# Patient Record
Sex: Male | Born: 1980 | Hispanic: No | Smoking: Never smoker
Health system: Southern US, Community
[De-identification: ages and names within clinical notes are randomized; demographics above are authoritative.]

## PROBLEM LIST (undated history)

## (undated) DIAGNOSIS — J45909 Unspecified asthma, uncomplicated: Secondary | ICD-10-CM

## (undated) DIAGNOSIS — G43909 Migraine, unspecified, not intractable, without status migrainosus: Secondary | ICD-10-CM

## (undated) DIAGNOSIS — T7840XA Allergy, unspecified, initial encounter: Secondary | ICD-10-CM

## (undated) HISTORY — DX: Migraine, unspecified, not intractable, without status migrainosus: G43.909

## (undated) HISTORY — DX: Unspecified asthma, uncomplicated: J45.909

## (undated) HISTORY — DX: Allergy, unspecified, initial encounter: T78.40XA

## (undated) HISTORY — PX: WISDOM TOOTH EXTRACTION: SHX21

---

## 2019-07-17 DIAGNOSIS — R030 Elevated blood-pressure reading, without diagnosis of hypertension: Secondary | ICD-10-CM | POA: Diagnosis not present

## 2019-07-17 DIAGNOSIS — R3989 Other symptoms and signs involving the genitourinary system: Secondary | ICD-10-CM | POA: Diagnosis not present

## 2019-08-04 ENCOUNTER — Ambulatory Visit (INDEPENDENT_AMBULATORY_CARE_PROVIDER_SITE_OTHER): Payer: Federal, State, Local not specified - PPO | Admitting: Medical

## 2019-08-04 ENCOUNTER — Other Ambulatory Visit: Payer: Self-pay

## 2019-08-04 ENCOUNTER — Encounter: Payer: Self-pay | Admitting: Medical

## 2019-08-04 VITALS — BP 120/70 | HR 64 | Temp 98.4°F | Resp 16 | Ht 74.0 in | Wt 179.4 lb

## 2019-08-04 DIAGNOSIS — Z113 Encounter for screening for infections with a predominantly sexual mode of transmission: Secondary | ICD-10-CM

## 2019-08-04 DIAGNOSIS — Z0000 Encounter for general adult medical examination without abnormal findings: Secondary | ICD-10-CM | POA: Diagnosis not present

## 2019-08-04 DIAGNOSIS — K529 Noninfective gastroenteritis and colitis, unspecified: Secondary | ICD-10-CM

## 2019-08-04 DIAGNOSIS — R7989 Other specified abnormal findings of blood chemistry: Secondary | ICD-10-CM

## 2019-08-04 NOTE — Progress Notes (Signed)
Subjective:   HPI  Quincy Boy is a 38 y.o. male who presents for Chief Complaint  Patient presents with  . CPE    fasting CPE  NP     Patient Care Team: , Camelia Eng, PA-C as PCP - General (Family Medicine) Sees dentist  Concerns: Lived in [place], moved to [place].     Was seeing [facility] in [place] prior.  Recurrent diarrhea, most of the time stools are loose.  chronic for years since college years.  Anxiety maybe plays a role.   Has wondered about IBS.  Certain foods can trigger symptoms, particularly fried foods.  No blood in stool.   Has family history of colon cancer.  Dad is african Optometrist.  Mother is [place].    Recent saw urgent care for irritation at tip of penis.  Was having some dehydration from running out in the heat.   Had UA that showed protein and ketones in urine.  Had negative STD screen.   Those symptoms have now resolved.   Reviewed their medical, surgical, family, social, medication, and allergy history and updated chart as appropriate.  Past Medical History:  Diagnosis Date  . Allergy   . Asthma    childhood, last flare worse age 55yo and younger  . Migraine    diagnosed in 74s, none in a while, worse in 20s.  as of 07/2019    Past Surgical History:  Procedure Laterality Date  . WISDOM TOOTH EXTRACTION      Social History   Socioeconomic History  . Marital status: Single    Spouse name: Not on file  . Number of children: Not on file  . Years of education: Not on file  . Highest education level: Not on file  Occupational History  . Not on file  Social Needs  . Financial resource strain: Not on file  . Food insecurity    Worry: Not on file    Inability: Not on file  . Transportation needs    Medical: Not on file    Non-medical: Not on file  Tobacco Use  . Smoking status: Never Smoker  . Smokeless tobacco: Never Used  Substance and Sexual Activity  . Alcohol use: Yes    Alcohol/week: 6.0 standard drinks    Types: 6  Glasses of wine per week  . Drug use: Never  . Sexual activity: Not on file  Lifestyle  . Physical activity    Days per week: Not on file    Minutes per session: Not on file  . Stress: Not on file  Relationships  . Social Herbalist on phone: Not on file    Gets together: Not on file    Attends religious service: Not on file    Active member of club or organization: Not on file    Attends meetings of clubs or organizations: Not on file    Relationship status: Not on file  . Intimate partner violence    Fear of current or ex partner: Not on file    Emotionally abused: Not on file    Physically abused: Not on file    Forced sexual activity: Not on file  Other Topics Concern  . 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  Runs 3- 4 miles several days per week, some HIT work out, [employer]  3 times per week.    07/2019.    Family History  Problem Relation Age of Onset  . Arthritis Mother   . Sjogren's syndrome Mother        seropositive  . Osteoporosis Mother   . Hypertension Father   . Cancer Father        tonsillar cancer, possible HPV related  . Heart disease Maternal Grandmother   . Cancer Maternal Grandmother        colon  . COPD Maternal Grandmother   . Hypertension Maternal Grandfather   . Cancer Maternal Grandfather        breast  . Diabetes Paternal Grandmother   . Hypertension Paternal Grandmother   . Kidney disease Paternal Grandmother        dialysis    No current outpatient medications on file.  Allergies  Allergen Reactions  . Amoxicillin Other (See Comments)    Upset stomach     Review of Systems Constitutional: -fever, -chills, -sweats, -unexpected weight change, -decreased appetite, -fatigue Allergy: -sneezing, -itching, -congestion Dermatology: -changing moles, --rash, -lumps ENT: -runny nose, -ear pain, -sore throat,  -hoarseness, -sinus pain, -teeth pain, - ringing in ears, -hearing loss, -nosebleeds Cardiology: -chest pain, -palpitations, -swelling, -difficulty breathing when lying flat, -waking up short of breath Respiratory: -cough, -shortness of breath, -difficulty breathing with exercise or exertion, -wheezing, -coughing up blood Gastroenterology: -abdominal pain, -nausea, -vomiting, +diarrhea, -constipation, -blood in stool, -changes in bowel movement, -difficulty swallowing or eating Hematology: -bleeding, -bruising  Musculoskeletal: -joint aches, -muscle aches, -joint swelling, -back pain, -neck pain, -cramping, -changes in gait Ophthalmology: denies vision changes, eye redness, itching, discharge Urology: -burning with urination, -difficulty urinating, -blood in urine, -urinary frequency, -urgency, -incontinence Neurology: -headache, -weakness, -tingling, -numbness, -memory loss, -falls, -dizziness Psychology: -depressed mood, -agitation, -sleep problems Male GU: no testicular mass, pain, no lymph nodes swollen, no swelling, no rash.     Objective:  BP 120/70   Pulse 64   Temp 98.4 F (36.9 C) (Temporal)   Resp 16   Ht 6\' 2"  (1.88 m)   Wt 179 lb 6.4 oz (81.4 kg)   SpO2 98%   BMI 23.03 kg/m    General appearance: alert, no distress, WD/WN, pleasant African American/dutch Amercian male Skin: Mild facial acne, no worrisome lesions HEENT: normocephalic, conjunctiva/corneas normal, sclerae anicteric, PERRLA, EOMi, nares patent, no discharge or erythema, pharynx normal Oral cavity: MMM, tongue normal, teeth normal Neck: supple, no lymphadenopathy, no thyromegaly, no masses, normal ROM, no bruits Chest: non tender, normal shape and expansion Heart: RRR, normal S1, S2, no murmurs Lungs: CTA bilaterally, no wheezes, rhonchi, or rales Abdomen: +bs, soft, non tender, non distended, no masses, no hepatomegaly, no splenomegaly, no bruits Back: non tender, normal ROM, no scoliosis Musculoskeletal:  upper extremities non tender, no obvious deformity, normal ROM throughout, lower extremities non tender, no obvious deformity, normal ROM throughout Extremities: no edema, no cyanosis, no clubbing Pulses: 2+ symmetric, upper and lower extremities, normal cap refill Neurological: alert, oriented x 3, CN2-12 intact, strength normal upper extremities and lower extremities, sensation normal throughout, DTRs 2+ throughout, no cerebellar signs, gait normal Psychiatric: normal affect, behavior normal, pleasant  GU: normal male external genitalia,uncircumcised, nontender, no masses, no hernia, no lymphadenopathy Rectal: Deferred   Assessment and Plan :   Encounter Diagnoses  Name Primary?  . Encounter for health maintenance examination in adult Yes  . Chronic diarrhea   . Abnormal thyroid blood test   . Screen for STD (sexually transmitted disease)  Physical exam - discussed and counseled on healthy lifestyle, diet, exercise, preventative care, vaccinations, sick and well care, proper use of emergency dept and after hours care, and addressed their concerns.    Health screening: See your eye doctor yearly for routine vision care. See your dentist yearly for routine dental care including hygiene visits twice yearly.  Discussed STD testing, discussed prevention, condom use, means of transmission.  He notes recent gonorrhea chlamydia and trichomonas test negative through urgent care.  Cancer screening Advised monthly self testicular exam  Colonoscopy:  Discussed possible colonoscopy depending on labs.  He does have a history of chronic diarrhea which we discussed.  Discussed PSA, prostate exam, and prostate cancer screening risks/benefits.     Vaccinations: Advised yearly influenza vaccine He reports up-to-date on vaccines as he is a Engineer, civil (consulting)nurse works in healthcare through the [employer] [facility] and [facility].  We will try to get some vaccine records on file  Separate significant chronic issues  discussed: Chronic diarrhea-discussed possible causes, he will work on certain trigger food avoidance.  Labs today.  Consider GI consult.  Jillyn HiddenLeander was seen today for cpe.  Diagnoses and all orders for this visit:  Encounter for health maintenance examination in adult -     Comprehensive metabolic panel -     CBC with Differential/Platelet -     TSH -     Lipid panel -     HIV Antibody (routine testing w rflx) -     RPR -     T4, free  Chronic diarrhea  Abnormal thyroid blood test -     TSH -     T4, free  Screen for STD (sexually transmitted disease) -     HIV Antibody (routine testing w rflx) -     RPR   Follow-up pending labs, yearly for physical

## 2019-08-05 LAB — COMPREHENSIVE METABOLIC PANEL
ALT: 21 IU/L (ref 0–44)
AST: 24 IU/L (ref 0–40)
Albumin/Globulin Ratio: 2.3 — ABNORMAL HIGH (ref 1.2–2.2)
Albumin: 4.8 g/dL (ref 4.0–5.0)
Alkaline Phosphatase: 56 IU/L (ref 39–117)
BUN/Creatinine Ratio: 15 (ref 9–20)
BUN: 14 mg/dL (ref 6–20)
Bilirubin Total: 0.3 mg/dL (ref 0.0–1.2)
CO2: 24 mmol/L (ref 20–29)
Calcium: 9.6 mg/dL (ref 8.7–10.2)
Chloride: 101 mmol/L (ref 96–106)
Creatinine, Ser: 0.92 mg/dL (ref 0.76–1.27)
GFR calc Af Amer: 122 mL/min/{1.73_m2} (ref >59)
GFR calc non Af Amer: 106 mL/min/{1.73_m2} (ref >59)
Globulin, Total: 2.1 g/dL (ref 1.5–4.5)
Glucose: 97 mg/dL (ref 65–99)
Potassium: 4.3 mmol/L (ref 3.5–5.2)
Sodium: 141 mmol/L (ref 134–144)
Total Protein: 6.9 g/dL (ref 6.0–8.5)

## 2019-08-05 LAB — CBC WITH DIFFERENTIAL/PLATELET
Basophils Absolute: 0 10*3/uL (ref 0.0–0.2)
Basos: 0 %
EOS (ABSOLUTE): 0 10*3/uL (ref 0.0–0.4)
Eos: 0 %
Hematocrit: 46.8 % (ref 37.5–51.0)
Hemoglobin: 15.2 g/dL (ref 13.0–17.7)
Immature Grans (Abs): 0 10*3/uL (ref 0.0–0.1)
Immature Granulocytes: 0 %
Lymphocytes Absolute: 1.3 10*3/uL (ref 0.7–3.1)
Lymphs: 18 %
MCH: 29 pg (ref 26.6–33.0)
MCHC: 32.5 g/dL (ref 31.5–35.7)
MCV: 89 fL (ref 79–97)
Monocytes Absolute: 0.5 10*3/uL (ref 0.1–0.9)
Monocytes: 6 %
Neutrophils Absolute: 5.6 10*3/uL (ref 1.4–7.0)
Neutrophils: 76 %
Platelets: 167 10*3/uL (ref 150–450)
RBC: 5.25 x10E6/uL (ref 4.14–5.80)
RDW: 13.3 % (ref 11.6–15.4)
WBC: 7.4 10*3/uL (ref 3.4–10.8)

## 2019-08-05 LAB — LIPID PANEL
Chol/HDL Ratio: 2.9 ratio (ref 0.0–5.0)
Cholesterol, Total: 146 mg/dL (ref 100–199)
HDL: 51 mg/dL (ref >39)
LDL Calculated: 86 mg/dL (ref 0–99)
Triglycerides: 46 mg/dL (ref 0–149)
VLDL Cholesterol Cal: 9 mg/dL (ref 5–40)

## 2019-08-05 LAB — HIV ANTIBODY (ROUTINE TESTING W REFLEX): HIV Screen 4th Generation wRfx: NONREACTIVE

## 2019-08-05 LAB — RPR: RPR Ser Ql: NONREACTIVE

## 2019-08-05 LAB — T4, FREE: Free T4: 1.24 ng/dL (ref 0.82–1.77)

## 2019-08-05 LAB — TSH: TSH: 0.702 u[IU]/mL (ref 0.450–4.500)

## 2019-08-18 ENCOUNTER — Telehealth: Payer: Self-pay | Admitting: Medical

## 2019-08-18 NOTE — Telephone Encounter (Signed)
Received requested records from [facility].

## 2019-08-31 ENCOUNTER — Telehealth: Payer: Self-pay | Admitting: Medical

## 2019-08-31 NOTE — Telephone Encounter (Signed)
Request records received from [facility]

## 2019-09-16 ENCOUNTER — Encounter: Payer: Self-pay | Admitting: Medical

## 2019-09-23 ENCOUNTER — Encounter: Payer: Self-pay | Admitting: Medical

## 2019-10-19 ENCOUNTER — Other Ambulatory Visit: Payer: Self-pay

## 2019-10-19 ENCOUNTER — Encounter: Payer: Self-pay | Admitting: Medical

## 2019-10-19 ENCOUNTER — Ambulatory Visit: Payer: Federal, State, Local not specified - PPO | Admitting: Medical

## 2019-10-19 VITALS — Temp 97.9°F | Ht 73.0 in | Wt 180.0 lb

## 2019-10-19 DIAGNOSIS — R05 Cough: Secondary | ICD-10-CM | POA: Diagnosis not present

## 2019-10-19 DIAGNOSIS — J3489 Other specified disorders of nose and nasal sinuses: Secondary | ICD-10-CM

## 2019-10-19 DIAGNOSIS — Z20822 Contact with and (suspected) exposure to covid-19: Secondary | ICD-10-CM

## 2019-10-19 DIAGNOSIS — R059 Cough, unspecified: Secondary | ICD-10-CM

## 2019-10-19 MED ORDER — CLARITHROMYCIN 500 MG PO TABS: 500.0000 mg | ORAL_TABLET | Freq: Two times a day (BID) | ORAL | 0 refills | Status: DC

## 2019-10-19 NOTE — Progress Notes (Signed)
Subjective:     Patient ID: Elijah Rojas, male   DOB: [birth year], 38 y.o.   MRN: [id number]  This visit type was conducted due to national recommendations for restrictions regarding the COVID-19 Pandemic (e.g. social distancing) in an effort to limit this patient's exposure and mitigate transmission in our community.  Due to their co-morbid illnesses, this patient is at least at moderate risk for complications without adequate follow up.  This format is felt to be most appropriate for this patient at this time.    Documentation for virtual audio and video telecommunications through Zoom encounter:  The patient was located at home. The provider was located in the office. The patient did consent to this visit and is aware of possible charges through their insurance for this visit.  The other persons participating in this telemedicine service were none. Time spent on call was 15 minutes and in review of previous records >15 minutes total.  This virtual service is not related to other E/M service within previous 7 days.   HPI Chief Complaint  Patient presents with  . Sinus Problem    nasal congestion, sinus drainage, green phlegm   Virtual consult today for possible sinus infection.  He reports about 4-5 day history of sinus pressure, sinus drainage, head congestion, mild sore throat, some cough related to drainage, but cannot seem to get rid of his congestion.  He is using Afrin nasal spray, over-the-counter sinus decongestant and guaifenesin.  No sick contacts.  No fever no nausea or vomiting, no shortness of breath, no wheezing, no change in smell or taste.  No dizziness. Ears feel a little stuffy, pressure.   Gets bad fall allergies.  Has a history of sinus infections for like this.  Using some salt water gargles.  Works in Corporate treasurer, worked directly with Covid patients several months ago when the pandemic for started.  Has had several negative Covid test in recent months.  No other  aggravating or relieving factors. No other complaint.  Past Medical History:  Diagnosis Date  . Allergy   . Asthma    childhood, last flare worse age 69yo and younger  . Migraine    diagnosed in 49s, none in a while, worse in 20s.  as of 07/2019   No current outpatient medications on file prior to visit.   No current facility-administered medications on file prior to visit.     Review of Systems As in subjective    Objective:   Physical Exam Due to coronavirus pandemic stay at home measures, patient visit was virtual and they were not examined in person.   Temp 97.9 F (36.6 C)   Ht 6\' 1"  (1.854 m)   Wt 180 lb (81.6 kg)   BMI 23.75 kg/m       Assessment:     Encounter Diagnoses  Name Primary?  . Sinus pressure Yes  . Cough        Plan:     Symptoms suggest possible early sinus infection.  Advised that I cannot completely rule out Covid given the spread in the community although his symptoms suggest more sinus related.  Advise he go for testing just to confirm for the sake of work and coworkers.  Discussed self quarantine until results come back to be safe.  Continue good hydration, add nasal saline flush, can continue Sudafed, cautioned on prolonged use of Afrin nasal spray.  If symptoms worsening or not improving at all in the next 48 hours can consider Biaxin antibiotic.  F/u pending lab.  Mahesh was seen today for sinus problem.  Diagnoses and all orders for this visit:  Sinus pressure -     Novel Coronavirus, NAA ([facility])  Cough -     Novel Coronavirus, NAA ([facility])  Other orders -     clarithromycin (BIAXIN) 500 MG tablet; Take 1 tablet (500 mg total) by mouth 2 (two) times daily.

## 2019-10-21 LAB — NOVEL CORONAVIRUS, NAA: SARS-CoV-2, NAA: NOT DETECTED

## 2020-06-19 ENCOUNTER — Other Ambulatory Visit: Payer: Self-pay

## 2020-06-19 ENCOUNTER — Emergency Department
Admission: RE | Admit: 2020-06-19 | Discharge: 2020-06-19 | Disposition: A | Discharge status: Home / Self Care | Payer: Federal, State, Local not specified - PPO | Source: Ambulatory Visit

## 2020-06-19 ENCOUNTER — Emergency Department (INDEPENDENT_AMBULATORY_CARE_PROVIDER_SITE_OTHER): Payer: Federal, State, Local not specified - PPO

## 2020-06-19 VITALS — BP 151/89 | HR 69 | Temp 98.7°F | Ht 73.0 in | Wt 190.0 lb

## 2020-06-19 DIAGNOSIS — S8991XA Unspecified injury of right lower leg, initial encounter: Secondary | ICD-10-CM | POA: Diagnosis not present

## 2020-06-19 DIAGNOSIS — W010XXA Fall on same level from slipping, tripping and stumbling without subsequent striking against object, initial encounter: Secondary | ICD-10-CM

## 2020-06-19 DIAGNOSIS — M25461 Effusion, right knee: Secondary | ICD-10-CM

## 2020-06-19 DIAGNOSIS — M25561 Pain in right knee: Secondary | ICD-10-CM

## 2020-06-19 DIAGNOSIS — M7989 Other specified soft tissue disorders: Secondary | ICD-10-CM | POA: Diagnosis not present

## 2020-06-19 DIAGNOSIS — T07XXXA Unspecified multiple injuries, initial encounter: Secondary | ICD-10-CM

## 2020-06-19 NOTE — Discharge Instructions (Signed)
°  You may take 500mg acetaminophen every 4-6 hours or in combination with ibuprofen 400-600mg every 6-8 hours as needed for pain and inflammation.  Call to schedule a follow up appointment with Sports Medicine in 1-2 weeks if not improving, sooner if worsening. 

## 2020-06-19 NOTE — ED Triage Notes (Signed)
Fall on knees coming out of [employer] x 3 days ago. Rt knee worse than left.

## 2020-06-19 NOTE — ED Provider Notes (Signed)
[facility]    CSN: [id number] Arrival date & time: 06/19/20  1453      History   Chief Complaint Chief Complaint  Patient presents with  . Fall    HPI Elijah Rojas is a 39 y.o. male.   HPI  Elijah Rojas is a 39 y.o. male presenting to UC with c/o bilateral knee pain, Right worse than Left after trip and fall at Home 3 days ago. Pt fell on asphalt. He has abrasions on both knees but swelling and soreness is worse in Right knee. No prior hx of knee problems. He has been taking 800mg  ibuprofen "around the clock," last dose around 10AM. Pain is 6/10.    Past Medical History:  Diagnosis Date  . Allergy   . Asthma    childhood, last flare worse age 20yo and younger  . Migraine    diagnosed in 73s, none in a while, worse in 20s.  as of 07/2019    Patient Active Problem List   Diagnosis Date Noted  . Chronic diarrhea 08/04/2019  . Screen for STD (sexually transmitted disease) 08/04/2019  . Abnormal thyroid blood test 08/04/2019    Past Surgical History:  Procedure Laterality Date  . WISDOM TOOTH EXTRACTION         Home Medications    Prior to Admission medications   Not on File    Family History Family History  Problem Relation Age of Onset  . Arthritis Mother   . Sjogren's syndrome Mother        seropositive  . Osteoporosis Mother   . Hypertension Father   . Cancer Father        tonsillar cancer, possible HPV related  . Heart disease Maternal Grandmother   . Cancer Maternal Grandmother        colon  . COPD Maternal Grandmother   . Hypertension Maternal Grandfather   . Cancer Maternal Grandfather        breast  . Diabetes Paternal Grandmother   . Hypertension Paternal Grandmother   . Kidney disease Paternal Grandmother        dialysis    Social History Social History   Tobacco Use  . Smoking status: Never Smoker  . Smokeless tobacco: Never Used  Vaping Use  . Vaping Use: Never used  Substance Use Topics  . Alcohol use: Yes      Alcohol/week: 6.0 standard drinks    Types: 6 Glasses of wine per week  . Drug use: Never     Allergies   Amoxicillin   Review of Systems Review of Systems  Musculoskeletal: Positive for arthralgias and joint swelling.  Skin: Positive for wound. Negative for color change.  Neurological: Negative for weakness and numbness.     Physical Exam Triage Vital Signs ED Triage Vitals  Enc Vitals Group     BP 06/19/20 1558 (!) 151/89     Pulse Rate 06/19/20 1558 69     Resp --      Temp 06/19/20 1558 98.7 F (37.1 C)     Temp Source 06/19/20 1558 Oral     SpO2 06/19/20 1558 100 %     Weight 06/19/20 1600 190 lb (86.2 kg)     Height 06/19/20 1600 6\' 1"  (1.854 m)     Head Circumference --      Peak Flow --      Pain Score 06/19/20 1559 6     Pain Loc --      Pain Edu? --  Excl. in GC? --    No data found.  Updated Vital Signs BP (!) 151/89 (BP Location: Right Arm)   Pulse 69   Temp 98.7 F (37.1 C) (Oral)   Ht 6\' 1"  (1.854 m)   Wt 190 lb (86.2 kg)   SpO2 100%   BMI 25.07 kg/m   Visual Acuity Right Eye Distance:   Left Eye Distance:   Bilateral Distance:    Right Eye Near:   Left Eye Near:    Bilateral Near:     Physical Exam Vitals and nursing note reviewed.  Constitutional:      Appearance: Normal appearance. He is well-developed.  HENT:     Head: Normocephalic and atraumatic.  Cardiovascular:     Rate and Rhythm: Normal rate.  Pulmonary:     Effort: Pulmonary effort is normal.  Musculoskeletal:        General: Swelling and tenderness present. Normal range of motion.     Cervical back: Normal range of motion.     Comments: Right knee: moderate edema, tenderness superior to and directly over patella. Full ROM w/o crepitus.  Skin:    General: Skin is warm and dry.     Comments: Bilateral anterior knees: superficial abrasions. No surrounding erythema. No purulent discharge.  Neurological:     Mental Status: He is alert and oriented to person,  place, and time.  Psychiatric:        Behavior: Behavior normal.      UC Treatments / Results  Labs (all labs ordered are listed, but only abnormal results are displayed) Labs Reviewed - No data to display  EKG   Radiology DG Knee Complete 4 Views Right  Result Date: 06/19/2020 CLINICAL DATA:  Fall 2 days ago.  Pain and swelling EXAM: RIGHT KNEE - COMPLETE 4+ VIEW COMPARISON:  None. FINDINGS: Negative for fracture, or joint effusion. Joint spaces normal. Anterior soft tissue swelling. IMPRESSION: Negative for fracture.  Anterior soft tissue swelling. Electronically Signed   By: 06/21/2020 M.D.   On: 06/19/2020 16:44    Procedures Procedures (including critical care time)  Medications Ordered in UC Medications - No data to display  Initial Impression / Assessment and Plan / UC Course  I have reviewed the triage vital signs and the nursing notes.  Pertinent labs & imaging results that were available during my care of the patient were reviewed by me and considered in my medical decision making (see chart for details).     Discussed imaging with pt Knee brace provided for comfort Encouraged f/u Sports Med as needed AVS provided  Final Clinical Impressions(s) / UC Diagnoses   Final diagnoses:  Fall from slip, trip, or stumble, initial encounter  Multiple abrasions  Pain and swelling of right knee     Discharge Instructions      You may take 500mg  acetaminophen every 4-6 hours or in combination with ibuprofen 400-600mg  every 6-8 hours as needed for pain and inflammation.  Call to schedule a follow up appointment with Sports Medicine in 1-2 weeks if not improving, sooner if worsening.     ED Prescriptions    None     PDMP not reviewed this encounter.   06/21/2020, 06/19/20 1701

## 2020-10-17 IMAGING — DX DG KNEE COMPLETE 4+V*R*
4 series · 4 of 4 positions shown · non-contrast
Comparison: None.

CLINICAL DATA: Fall 2 days ago.  Pain and swelling

EXAM:
RIGHT KNEE - COMPLETE 4+ VIEW

[knee ap]
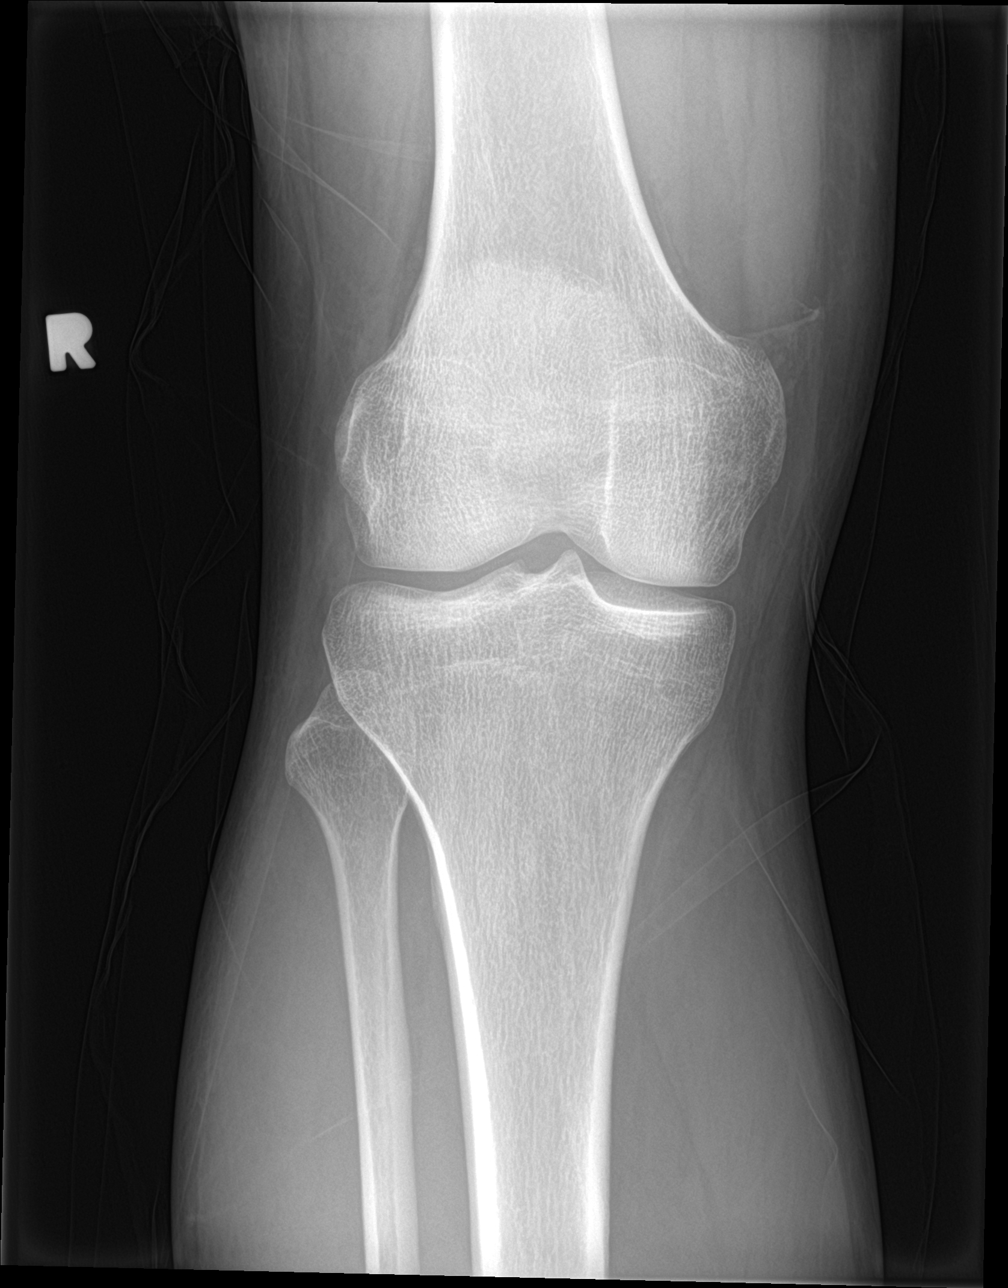

[knee lat]
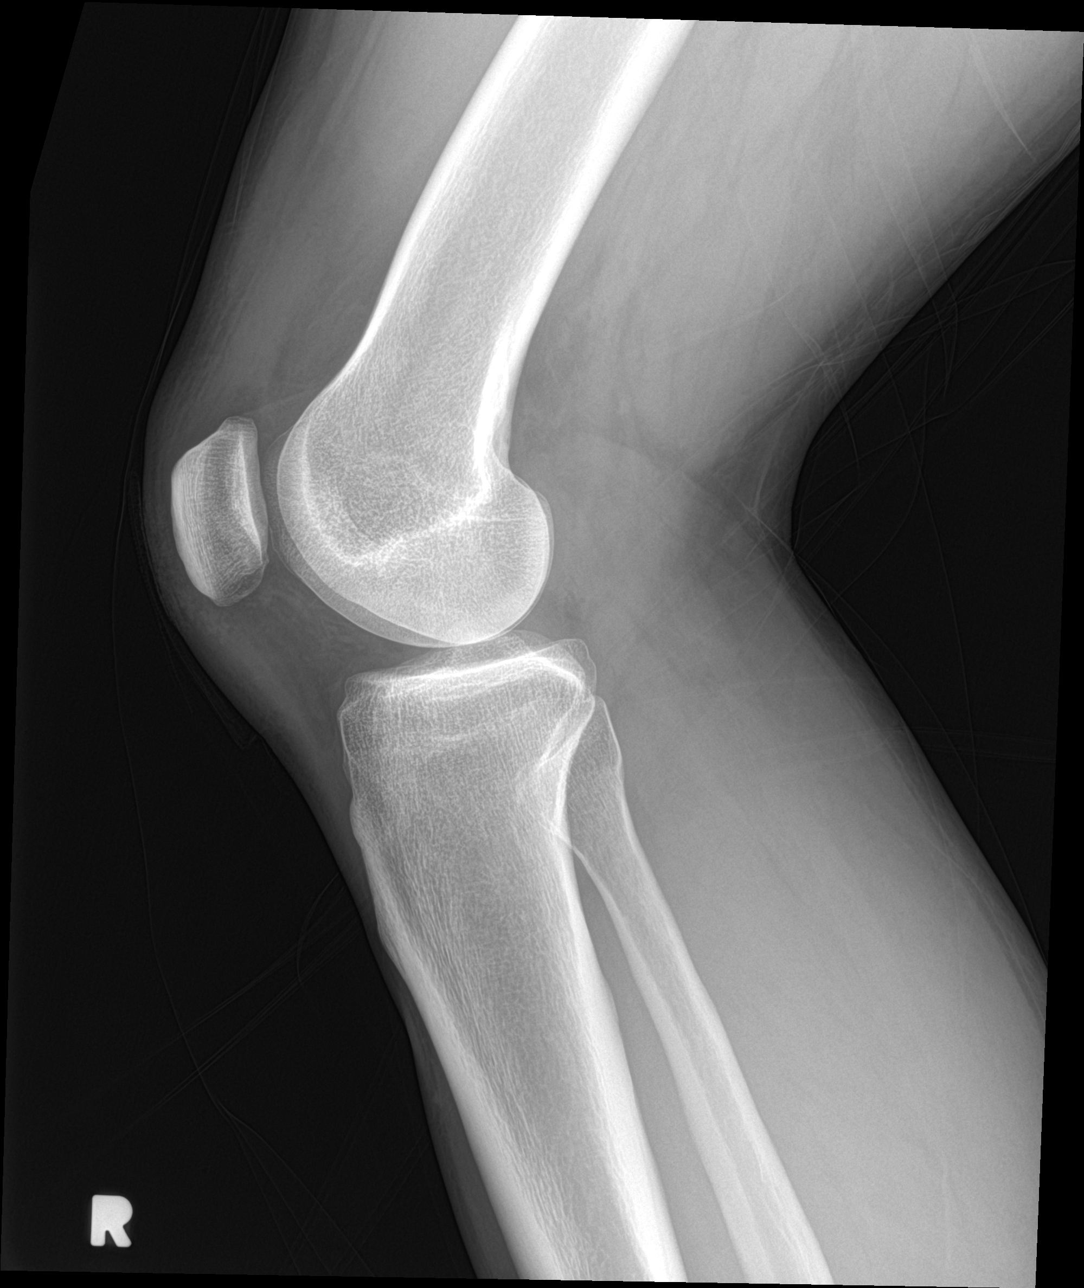

[knee obl (1 of 2)]
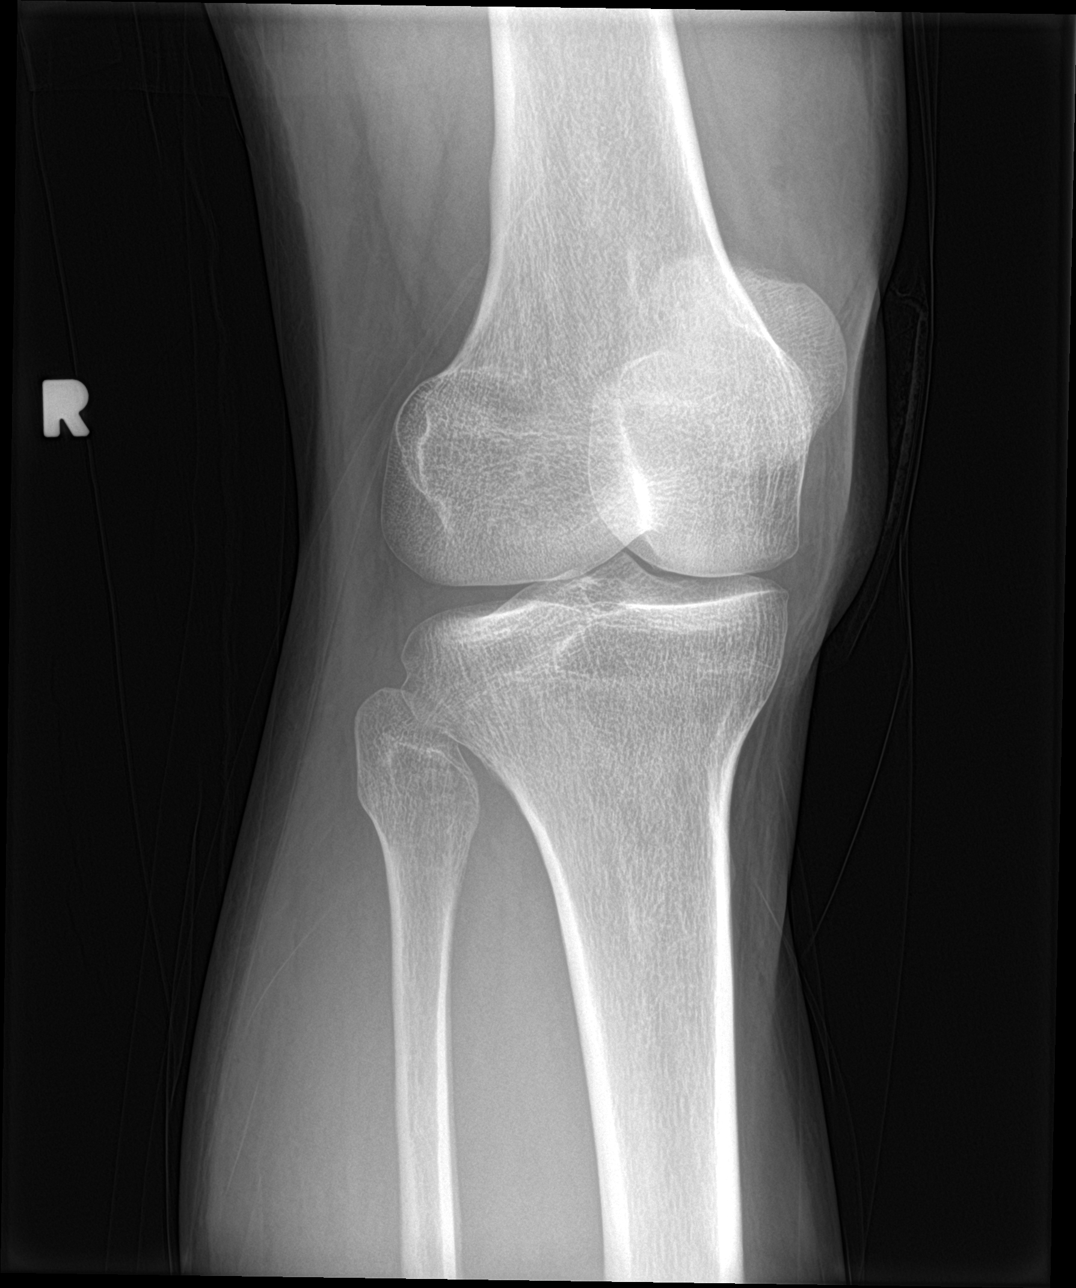

[knee obl (2 of 2)]
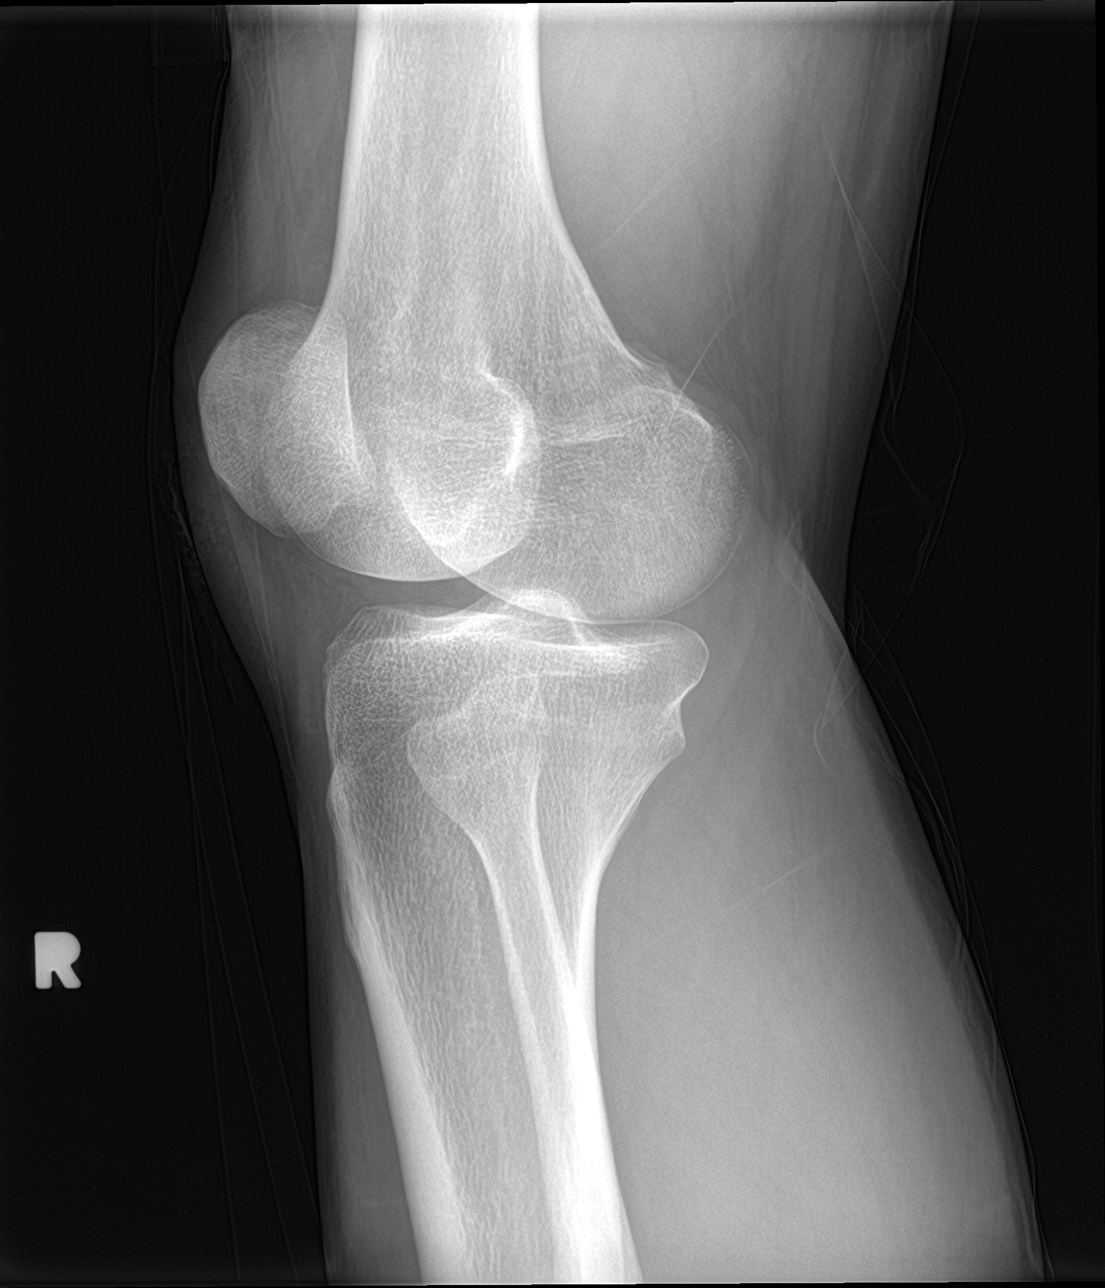

[4 of 4 positions shown; findings below may reference images not displayed]

FINDINGS: Negative for fracture, or joint effusion. Joint spaces normal.
Anterior soft tissue swelling.
IMPRESSION: Negative for fracture.  Anterior soft tissue swelling.

## 2020-12-14 ENCOUNTER — Telehealth: Payer: Federal, State, Local not specified - PPO | Admitting: Emergency Medicine

## 2020-12-14 DIAGNOSIS — R300 Dysuria: Secondary | ICD-10-CM

## 2020-12-14 NOTE — Progress Notes (Signed)
Based on what you shared with me, I feel your condition warrants further evaluation and I recommend that you be seen for a face to face office visit.  Male bladder infections are not very common.  We worry about prostate or kidney conditions.  The standard of care is to examine the abdomen and kidneys, and to do a urine and blood test to make sure that something more serious is not going on.  We recommend that you see a provider today.  If your doctor's office is closed [facility] has the following Urgent Cares:    NOTE: If you entered your credit card information for this eVisit, you will not be charged. You may see a "hold" on your card for the $35 but that hold will drop off and you will not have a charge processed.   If you are having a true medical emergency please call 911.     For an urgent face to face visit, [facility] has four urgent care centers for your convenience:    NEW:  [facility] [phone] [address] [place], [place] [address] .  Monday - Friday 10 am - 6 pm    . [facility]    [phone]                  Get Driving Directions  [address] , [place] [address] . 10 am to 8 pm Monday-Friday . 12 pm to 8 pm Saturday-Sunday   . [facility]  [phone]                  Get Driving Directions  [address] [place], [place] [address] . 8 am to 8 pm Monday-Friday . 9 am to 6 pm Saturday . 11 am to 6 pm Sunday   . [facility]  [phone]                  Get Driving Directions   [address] [place], [place] [address] . 8 am to 8 pm Monday-Friday . 8 am to 4 pm Saturday-Sunday    . [facility] [place]                    Get Driving Directions  [phone]  [address] [place], [place] [address]  . Monday-Friday, 12 PM to 6 PM    Your e-visit  answers were reviewed by a board certified advanced clinical practitioner to complete your personal care plan.  Thank you for using e-Visits.  Approximately 5 minutes was used in reviewing the patient's chart, questionnaire, prescribing medications, and documentation.  

## 2020-12-15 ENCOUNTER — Encounter: Payer: Self-pay | Admitting: Medical

## 2020-12-15 ENCOUNTER — Other Ambulatory Visit: Payer: Self-pay

## 2020-12-15 ENCOUNTER — Ambulatory Visit: Payer: Federal, State, Local not specified - PPO | Admitting: Medical

## 2020-12-15 VITALS — BP 126/86 | HR 83 | Ht 73.0 in | Wt 188.6 lb

## 2020-12-15 DIAGNOSIS — Z113 Encounter for screening for infections with a predominantly sexual mode of transmission: Secondary | ICD-10-CM | POA: Diagnosis not present

## 2020-12-15 DIAGNOSIS — B001 Herpesviral vesicular dermatitis: Secondary | ICD-10-CM

## 2020-12-15 DIAGNOSIS — R300 Dysuria: Secondary | ICD-10-CM | POA: Diagnosis not present

## 2020-12-15 DIAGNOSIS — N4889 Other specified disorders of penis: Secondary | ICD-10-CM | POA: Diagnosis not present

## 2020-12-15 LAB — POCT URINALYSIS DIP (PROADVANTAGE DEVICE)
Bilirubin, UA: NEGATIVE
Blood, UA: NEGATIVE
Glucose, UA: NEGATIVE mg/dL
Ketones, POC UA: NEGATIVE mg/dL
Leukocytes, UA: NEGATIVE
Nitrite, UA: NEGATIVE
Protein Ur, POC: NEGATIVE mg/dL
Specific Gravity, Urine: 1.025
Urobilinogen, Ur: 0.2
pH, UA: 6 (ref 5.0–8.0)

## 2020-12-15 MED ORDER — VALACYCLOVIR HCL 1 G PO TABS: 2000.0000 mg | ORAL_TABLET | Freq: Two times a day (BID) | ORAL | 0 refills | Status: DC

## 2020-12-15 NOTE — Progress Notes (Signed)
Subjective:  Elijah Rojas is a 39 y.o. male who presents for Chief Complaint  Patient presents with   Urinary Tract Infection    Burning with urination seems better but still some irritation      Here today for concern about urination.  He notes a week history of burning with urination, irritation of the penis but no rash.  No swollen nodes, no body aches or chills no fever no nausea or vomiting.  No penile discharge.  No blood in the urine.  No bowel changes.  He does have unprotected sex with his male partner.   He notes getting chlamydia from the same partner a few years ago and had similar symptoms.  He is concerned about possible STD.  Currently he and his partner do not endorse other partners.  He does drink a fair amount of caffeine.  No recent changes in soap or hygiene products.  He has a cold sore that just started a few days ago in his nose.  No prior use of Valtrex.  Typically gets 1-2 cold sores per year.  No other aggravating or relieving factors.    No other c/o.  The following portions of the patient's history were reviewed and updated as appropriate: allergies, current medications, past family history, past medical history, past social history, past surgical history and problem list.  ROS Otherwise as in subjective above    Objective: BP 126/86    Pulse 83    Ht 6\' 1"  (1.854 m)    Wt 188 lb 9.6 oz (85.5 kg)    SpO2 98%    BMI 24.88 kg/m   General appearance: alert, no distress, well developed, well nourished GU: Normal GU, uncircumcised, no mass no nodules no lymphadenopathy no rash noted chancre   Assessment: Encounter Diagnoses  Name Primary?   Dysuria Yes   Penile irritation    Screen for STD (sexually transmitted disease)    Cold sore      Plan: Lab testing today.  Hydrate well, limit caffeine.  We discussed possible differential.  Need to rule out STD.  Urinalysis normal today.  Cold sore history-discussed proper use of Valtrex for flareup.  No  indication for prophylactic therapy daily for prevention at this point  Vedanth was seen today for urinary tract infection.  Diagnoses and all orders for this visit:  Dysuria -     GC/Chlamydia Probe Amp -     Cancel: HIV Antibody (routine testing w rflx) -     Cancel: RPR -     HIV Antibody (routine testing w rflx) -     RPR -     POCT Urinalysis DIP (Proadvantage Device)  Penile irritation -     GC/Chlamydia Probe Amp -     Cancel: HIV Antibody (routine testing w rflx) -     Cancel: RPR -     HIV Antibody (routine testing w rflx) -     RPR -     POCT Urinalysis DIP (Proadvantage Device)  Screen for STD (sexually transmitted disease) -     GC/Chlamydia Probe Amp -     Cancel: HIV Antibody (routine testing w rflx) -     Cancel: RPR -     HIV Antibody (routine testing w rflx) -     RPR  Cold sore  Other orders -     valACYclovir (VALTREX) 1000 MG tablet; Take 2 tablets (2,000 mg total) by mouth 2 (two) times daily. X 1 day for flare up  Follow up: pending labs

## 2020-12-16 LAB — RPR: RPR Ser Ql: NONREACTIVE

## 2020-12-16 LAB — HIV ANTIBODY (ROUTINE TESTING W REFLEX): HIV Screen 4th Generation wRfx: NONREACTIVE

## 2020-12-18 ENCOUNTER — Other Ambulatory Visit: Payer: Self-pay | Admitting: Medical

## 2020-12-18 LAB — GC/CHLAMYDIA PROBE AMP
Chlamydia trachomatis, NAA: NEGATIVE
Neisseria Gonorrhoeae by PCR: NEGATIVE

## 2020-12-18 MED ORDER — AZITHROMYCIN 500 MG PO TABS: 1000.0000 mg | ORAL_TABLET | Freq: Once | ORAL | 0 refills | Status: AC

## 2020-12-21 ENCOUNTER — Other Ambulatory Visit: Payer: Self-pay | Admitting: Medical

## 2020-12-21 MED ORDER — SULFAMETHOXAZOLE-TRIMETHOPRIM 800-160 MG PO TABS
1.0000 | ORAL_TABLET | Freq: Two times a day (BID) | ORAL | 0 refills | Status: DC
Start: 2020-12-21 — End: 2021-01-19

## 2021-01-19 ENCOUNTER — Ambulatory Visit: Payer: Federal, State, Local not specified - PPO | Admitting: Medical

## 2021-01-19 ENCOUNTER — Other Ambulatory Visit: Payer: Self-pay

## 2021-01-19 ENCOUNTER — Encounter: Payer: Self-pay | Admitting: Medical

## 2021-01-19 VITALS — BP 140/78 | HR 67 | Ht 73.0 in | Wt 188.2 lb

## 2021-01-19 DIAGNOSIS — Z0000 Encounter for general adult medical examination without abnormal findings: Secondary | ICD-10-CM | POA: Diagnosis not present

## 2021-01-19 DIAGNOSIS — Z23 Encounter for immunization: Secondary | ICD-10-CM | POA: Diagnosis not present

## 2021-01-19 DIAGNOSIS — R5381 Other malaise: Secondary | ICD-10-CM

## 2021-01-19 DIAGNOSIS — Z113 Encounter for screening for infections with a predominantly sexual mode of transmission: Secondary | ICD-10-CM

## 2021-01-19 DIAGNOSIS — Z1159 Encounter for screening for other viral diseases: Secondary | ICD-10-CM

## 2021-01-19 DIAGNOSIS — G8929 Other chronic pain: Secondary | ICD-10-CM

## 2021-01-19 DIAGNOSIS — Z13220 Encounter for screening for lipoid disorders: Secondary | ICD-10-CM

## 2021-01-19 DIAGNOSIS — R102 Pelvic and perineal pain: Secondary | ICD-10-CM

## 2021-01-19 DIAGNOSIS — R946 Abnormal results of thyroid function studies: Secondary | ICD-10-CM | POA: Diagnosis not present

## 2021-01-19 LAB — POCT URINALYSIS DIP (PROADVANTAGE DEVICE)
Bilirubin, UA: NEGATIVE
Blood, UA: NEGATIVE
Glucose, UA: NEGATIVE mg/dL
Ketones, POC UA: NEGATIVE mg/dL
Leukocytes, UA: NEGATIVE
Nitrite, UA: NEGATIVE
Specific Gravity, Urine: 1.025
Urobilinogen, Ur: 0.2
pH, UA: 6 (ref 5.0–8.0)

## 2021-01-19 NOTE — Progress Notes (Signed)
Subjective:   HPI  Elijah Rojas is a 40 y.o. male who presents for Chief Complaint  Patient presents with  . Annual Exam    Physical with fasting labs. Needs Tetanus vaccine     Patient Care Team: Willman Cuny, Kermit Balo, PA-C as PCP - General (Family Medicine) Sees dentist  Concerns: Saw me few weeks ago for possible prostate vs STD concern.   Finished 2 different antibiotic but still doesn't feel right in pelvis.  Partner did not do any treatment at that time.   No fever, no body aches or chills.  Needs Td updated.  Has family history of colon cancer.  Dad is african Naval architect.  Mother is [place].    Reviewed their medical, surgical, family, social, medication, and allergy history and updated chart as appropriate.  Past Medical History:  Diagnosis Date  . Allergy   . Asthma    childhood, last flare worse age 66yo and younger  . Migraine    diagnosed in 52s, none in a while, worse in 20s.  as of 07/2019    Past Surgical History:  Procedure Laterality Date  . WISDOM TOOTH EXTRACTION      Social History   Socioeconomic History  . Marital status: Significant Other    Spouse name: Not on file  . Number of children: Not on file  . Years of education: Not on file  . Highest education level: Not on file  Occupational History  . Not on file  Tobacco Use  . Smoking status: Never Smoker  . Smokeless tobacco: Never Used  Vaping Use  . Vaping Use: Never used  Substance and Sexual Activity  . Alcohol use: Yes    Alcohol/week: 6.0 standard drinks    Types: 6 Glasses of wine per week  . Drug use: Never  . Sexual activity: Not on file  Other Topics Concern  . 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  Runs 3- 4 miles several days per week, some HIT work out, weights 3 times per week.   12/2020   Social Determinants of Health   Financial Resource Strain: Not  on file  Food Insecurity: Not on file  Transportation Needs: Not on file  Physical Activity: Not on file  Stress: Not on file  Social Connections: Not on file  Intimate Partner Violence: Not on file    Family History  Problem Relation Age of Onset  . Arthritis Mother   . Sjogren's syndrome Mother        seropositive  . Osteoporosis Mother   . Hypertension Father   . Cancer Father        tonsillar cancer, possible HPV related  . Heart disease Maternal Grandmother   . COPD Maternal Grandmother   . Hypertension Maternal Grandfather   . Cancer Maternal Grandfather        colon  . Hypertension Paternal Grandmother   . Kidney disease Paternal Grandmother        dialysis  . Cancer Paternal Grandmother        breast  . Cancer Maternal Aunt        colon  . Diabetes Paternal Grandfather      Current Outpatient Medications:  .  valACYclovir (VALTREX) 1000 MG tablet, Take 2 tablets (2,000 mg total) by mouth 2 (two) times daily. X 1 day for flare up (  Patient not taking: Reported on 01/19/2021), Disp: 30 tablet, Rfl: 0  Allergies  Allergen Reactions  . Amoxicillin Other (See Comments)    Upset stomach     Review of Systems Constitutional: -fever, -chills, -sweats, -unexpected weight change, -decreased appetite, -fatigue Allergy: -sneezing, -itching, -congestion Dermatology: -changing moles, --rash, -lumps ENT: -runny nose, -ear pain, -sore throat, -hoarseness, -sinus pain, -teeth pain, - ringing in ears, -hearing loss, -nosebleeds Cardiology: -chest pain, -palpitations, -swelling, -difficulty breathing when lying flat, -waking up short of breath Respiratory: -cough, -shortness of breath, -difficulty breathing with exercise or exertion, -wheezing, -coughing up blood Gastroenterology: -abdominal pain, -nausea, -vomiting, -diarrhea, -constipation, -blood in stool, -changes in bowel movement, -difficulty swallowing or eating Hematology: -bleeding, -bruising  Musculoskeletal: -joint  aches, -muscle aches, -joint swelling, -back pain, -neck pain, -cramping, -changes in gait Ophthalmology: denies vision changes, eye redness, itching, discharge Urology: -burning with urination, -difficulty urinating, -blood in urine, -urinary frequency, -urgency, -incontinence Neurology: -headache, -weakness, -tingling, -numbness, -memory loss, -falls, -dizziness Psychology: -depressed mood, -agitation, -sleep problems Male GU: no testicular mass, +pain, no lymph nodes swollen, no swelling, no rash.     Objective:  BP 140/78   Pulse 67   Ht 6\' 1"  (1.854 m)   Wt 188 lb 3.2 oz (85.4 kg)   SpO2 98%   BMI 24.83 kg/m    General appearance: alert, no distress, WD/WN, pleasant African American/dutch American male Skin: Mild facial acne, no worrisome lesions HEENT: normocephalic, conjunctiva/corneas normal, sclerae anicteric, PERRLA, EOMi, nares patent, no discharge or erythema, pharynx normal Oral cavity: MMM, tongue normal, teeth normal Neck: supple, no lymphadenopathy, no thyromegaly, no masses, normal ROM, no bruits Chest: non tender, normal shape and expansion Heart: RRR, normal S1, S2, no murmurs Lungs: CTA bilaterally, no wheezes, rhonchi, or rales Abdomen: +bs, soft, non tender, non distended, no masses, no hepatomegaly, no splenomegaly, no bruits Back: non tender, normal ROM, no scoliosis Musculoskeletal: upper extremities non tender, no obvious deformity, normal ROM throughout, lower extremities non tender, no obvious deformity, normal ROM throughout Extremities: no edema, no cyanosis, no clubbing Pulses: 2+ symmetric, upper and lower extremities, normal cap refill Neurological: alert, oriented x 3, CN2-12 intact, strength normal upper extremities and lower extremities, sensation normal throughout, DTRs 2+ throughout, no cerebellar signs, gait normal Psychiatric: normal affect, behavior normal, pleasant  GU: normal male external genitalia,uncircumcised, nontender, no masses, no  hernia, no lymphadenopathy Rectal: Deferred   Assessment and Plan :   Encounter Diagnoses  Name Primary?  . Encounter for health maintenance examination in adult Yes  . Chronic pelvic pain in male   . Need for Td vaccine   . Screen for STD (sexually transmitted disease)   . Screening for lipid disorders   . Malaise   . Encounter for hepatitis C screening test for low risk patient   . Abnormal thyroid exam     Physical exam - discussed and counseled on healthy lifestyle, diet, exercise, preventative care, vaccinations, sick and well care, proper use of emergency dept and after hours care, and addressed their concerns.    Health screening: See your eye doctor yearly for routine vision care. See your dentist yearly for routine dental care including hygiene visits twice yearly.  Discussed STD testing, discussed prevention, condom use, means of transmission.    Cancer screening Advised monthly self testicular exam  Colonoscopy:  Discussed possible colonoscopy depending on labs.    Discussed PSA, prostate exam, and prostate cancer screening risks/benefits.     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   Up to date on covid vaccine  Counseled on the Td (tetanus, diptheria) vaccine.  Vaccine information sheet given. Td vaccine given after consent obtained.    Separate significant chronic issues discussed: Family hx/o colon cancer - given family hx/o consider screening by age 21yo  Pelvic pain - repeat labs today.  If negative STD, consider another round of antibiotic for prostatitis     Ledell was seen today for annual exam.  Diagnoses and all orders for this visit:  Encounter for health maintenance examination in adult -     GC/Chlamydia Probe Amp -     PSA -     CBC with Differential/Platelet -     Comprehensive metabolic panel -     Lipid  panel -     POCT Urinalysis DIP (Proadvantage Device) -     Mycoplasma / Ureaplasma Culture -     Hepatitis C antibody -     TSH -     T4, free  Chronic pelvic pain in male  Need for Td vaccine  Screen for STD (sexually transmitted disease) -     GC/Chlamydia Probe Amp -     POCT Urinalysis DIP (Proadvantage Device) -     Mycoplasma / Ureaplasma Culture -     Hepatitis C antibody  Screening for lipid disorders  Malaise -     PSA -     CBC with Differential/Platelet -     Comprehensive metabolic panel -     POCT Urinalysis DIP (Proadvantage Device)  Encounter for hepatitis C screening test for low risk patient  Abnormal thyroid exam -     TSH -     T4, free  Other orders -     Td : Tetanus/diphtheria >7yo Preservative  free   Follow-up pending labs, yearly for physical

## 2021-01-20 LAB — HEPATITIS C ANTIBODY: Hep C Virus Ab: <0.1 s/co ratio (ref 0.0–0.9)

## 2021-01-20 LAB — T4, FREE: Free T4: 1.29 ng/dL (ref 0.82–1.77)

## 2021-01-20 LAB — TSH: TSH: 0.683 u[IU]/mL (ref 0.450–4.500)

## 2021-01-25 LAB — CBC WITH DIFFERENTIAL/PLATELET
Basophils Absolute: 0 10*3/uL (ref 0.0–0.2)
Basos: 0 %
EOS (ABSOLUTE): 0 10*3/uL (ref 0.0–0.4)
Eos: 0 %
Hematocrit: 45.2 % (ref 37.5–51.0)
Hemoglobin: 15.4 g/dL (ref 13.0–17.7)
Immature Grans (Abs): 0 10*3/uL (ref 0.0–0.1)
Immature Granulocytes: 0 %
Lymphocytes Absolute: 2 10*3/uL (ref 0.7–3.1)
Lymphs: 21 %
MCH: 29.8 pg (ref 26.6–33.0)
MCHC: 34.1 g/dL (ref 31.5–35.7)
MCV: 88 fL (ref 79–97)
Monocytes Absolute: 0.5 10*3/uL (ref 0.1–0.9)
Monocytes: 5 %
Neutrophils Absolute: 6.6 10*3/uL (ref 1.4–7.0)
Neutrophils: 74 %
Platelets: 166 10*3/uL (ref 150–450)
RBC: 5.16 x10E6/uL (ref 4.14–5.80)
RDW: 12.7 % (ref 11.6–15.4)
WBC: 9.2 10*3/uL (ref 3.4–10.8)

## 2021-01-25 LAB — LIPID PANEL
Chol/HDL Ratio: 2.9 ratio (ref 0.0–5.0)
Cholesterol, Total: 157 mg/dL (ref 100–199)
HDL: 54 mg/dL (ref >39)
LDL Chol Calc (NIH): 91 mg/dL (ref 0–99)
Triglycerides: 61 mg/dL (ref 0–149)
VLDL Cholesterol Cal: 12 mg/dL (ref 5–40)

## 2021-01-25 LAB — COMPREHENSIVE METABOLIC PANEL
ALT: 23 IU/L (ref 0–44)
AST: 31 IU/L (ref 0–40)
Albumin/Globulin Ratio: 2.1 (ref 1.2–2.2)
Albumin: 4.9 g/dL (ref 4.0–5.0)
Alkaline Phosphatase: 52 IU/L (ref 44–121)
BUN/Creatinine Ratio: 16 (ref 9–20)
BUN: 16 mg/dL (ref 6–20)
Bilirubin Total: 0.5 mg/dL (ref 0.0–1.2)
CO2: 24 mmol/L (ref 20–29)
Calcium: 9.5 mg/dL (ref 8.7–10.2)
Chloride: 102 mmol/L (ref 96–106)
Creatinine, Ser: 1.01 mg/dL (ref 0.76–1.27)
GFR calc Af Amer: 109 mL/min/{1.73_m2} (ref >59)
GFR calc non Af Amer: 94 mL/min/{1.73_m2} (ref >59)
Globulin, Total: 2.3 g/dL (ref 1.5–4.5)
Glucose: 87 mg/dL (ref 65–99)
Potassium: 4.1 mmol/L (ref 3.5–5.2)
Sodium: 139 mmol/L (ref 134–144)
Total Protein: 7.2 g/dL (ref 6.0–8.5)

## 2021-01-25 LAB — MYCOPLASMA / UREAPLASMA CULTURE
Mycoplasma hominis Culture: NEGATIVE
Ureaplasma urealyticum: NEGATIVE

## 2021-01-25 LAB — GC/CHLAMYDIA PROBE AMP
Chlamydia trachomatis, NAA: NEGATIVE
Neisseria Gonorrhoeae by PCR: NEGATIVE

## 2021-01-25 LAB — PSA: Prostate Specific Ag, Serum: 1.8 ng/mL (ref 0.0–4.0)

## 2021-01-26 ENCOUNTER — Other Ambulatory Visit: Payer: Self-pay | Admitting: Medical

## 2021-01-26 DIAGNOSIS — G8929 Other chronic pain: Secondary | ICD-10-CM

## 2021-01-26 DIAGNOSIS — R102 Pelvic and perineal pain: Secondary | ICD-10-CM

## 2021-01-26 MED ORDER — SULFAMETHOXAZOLE-TRIMETHOPRIM 800-160 MG PO TABS: 1.0000 | ORAL_TABLET | Freq: Two times a day (BID) | ORAL | 0 refills | Status: DC

## 2021-02-28 DIAGNOSIS — R102 Pelvic and perineal pain: Secondary | ICD-10-CM | POA: Diagnosis not present

## 2021-05-21 ENCOUNTER — Telehealth: Payer: Self-pay | Admitting: Medical

## 2021-05-21 NOTE — Telephone Encounter (Signed)
See form, did he request this?

## 2021-05-22 NOTE — Telephone Encounter (Signed)
Inquiring with patient via mychart.

## 2021-05-23 NOTE — Telephone Encounter (Signed)
Return form to me as he sent word by my chart

## 2021-05-24 NOTE — Telephone Encounter (Signed)
Fax form.

## 2021-05-24 NOTE — Telephone Encounter (Signed)
Done

## 2021-09-10 ENCOUNTER — Other Ambulatory Visit: Payer: Self-pay

## 2021-09-10 ENCOUNTER — Emergency Department
Admission: RE | Admit: 2021-09-10 | Discharge: 2021-09-10 | Disposition: A | Discharge status: Home / Self Care | Payer: Federal, State, Local not specified - PPO | Source: Ambulatory Visit | Attending: Family Medicine | Admitting: Family Medicine

## 2021-09-10 VITALS — BP 137/85 | HR 131 | Temp 101.0°F | Resp 20 | Ht 73.0 in | Wt 185.0 lb

## 2021-09-10 DIAGNOSIS — R509 Fever, unspecified: Secondary | ICD-10-CM

## 2021-09-10 DIAGNOSIS — J111 Influenza due to unidentified influenza virus with other respiratory manifestations: Secondary | ICD-10-CM

## 2021-09-10 LAB — POCT RAPID STREP A (OFFICE): Rapid Strep A Screen: NEGATIVE

## 2021-09-10 MED ORDER — ONDANSETRON 4 MG PO TBDP: 4.0000 mg | ORAL_TABLET | Freq: Once | ORAL | Status: DC

## 2021-09-10 MED ORDER — ACETAMINOPHEN 325 MG PO TABS
650.0000 mg | ORAL_TABLET | Freq: Once | ORAL | Status: AC
  Administered 2021-09-10: 650 mg via ORAL

## 2021-09-10 NOTE — ED Triage Notes (Addendum)
Pt presents to Urgent Care with c/o generalized body aches since yesterday and fever w/ chills today. Reports taking a rapid COVID PCR test this afternoon which was negative.

## 2021-09-10 NOTE — Discharge Instructions (Signed)
Drink lots of fluids Take ibuprofen or Tylenol for pain and fever May take over-the-counter cough and cold medicine if needed Check MyChart for your test results

## 2021-09-11 NOTE — ED Provider Notes (Signed)
[name]    CSN: [id number] Arrival date & time: 09/10/21  1537      History   Chief Complaint Chief Complaint  Patient presents with   Fever   Generalized Body Aches    HPI Elijah Rojas is a 40 y.o. male.   HPI  Very pleasant gentleman.  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  He is here for fever.  His temperatures to 103.  He had a COVID test done at work yesterday.  They did a rapid PCR that takes 90 minutes, it was negative.  He is COVID vaccinated and flu vaccinated yearly.  No known exposure to illness except through his patients at work.  He has had generalized body aches, fever and chills, and some cough.  No shortness of breath.  No sore throat.  No change in taste or smell.  Past Medical History:  Diagnosis Date   Allergy    Asthma    childhood, last flare worse age 56yo and younger   Migraine    diagnosed in 90s, none in a while, worse in 62s.  as of 07/2019    Patient Active Problem List   Diagnosis Date Noted   Encounter for health maintenance examination in adult 01/19/2021   Need for Td vaccine 01/19/2021   Screening for lipid disorders 01/19/2021   Chronic pelvic pain in male 01/19/2021   [facility] 01/19/2021   Encounter for hepatitis C screening test for low risk patient 01/19/2021   Abnormal thyroid exam 01/19/2021   Screen for STD (sexually transmitted disease) 08/04/2019    Past Surgical History:  Procedure Laterality Date   WISDOM TOOTH EXTRACTION         Home Medications    Prior to Admission medications   Medication Sig Start Date End Date Taking? Authorizing Provider  ibuprofen (ADVIL) 400 MG tablet Take 400 mg by mouth every 6 (six) hours as needed.   Yes [provider]    Family History Family History  Problem Relation Age of Onset   Arthritis Mother    Sjogren's syndrome Mother        seropositive   Osteoporosis Mother    Hypertension  Father    Cancer Father        tonsillar cancer, possible HPV related   Heart disease Maternal Grandmother    COPD Maternal Grandmother    Hypertension Maternal Grandfather    Cancer Maternal Grandfather        colon   Hypertension Paternal Grandmother    Kidney disease Paternal Grandmother        dialysis   Cancer Paternal Grandmother        breast   Cancer Maternal Aunt        colon   Diabetes Paternal Grandfather     Social History Social History   Tobacco Use   Smoking status: Never   Smokeless tobacco: Never  Vaping Use   Vaping Use: Never used  Substance Use Topics   Alcohol use: Yes    Alcohol/week: 6.0 standard drinks    Types: 6 Glasses of wine per week    Comment: 5-6 weekly   Drug use: Never     Allergies   Amoxicillin   Review of Systems Review of Systems See HPI  Physical Exam Triage Vital Signs ED Triage Vitals  Enc Vitals Group     BP 09/10/21 1607 (!) 155/104  Pulse Rate 09/10/21 1607 (!) 123     Resp 09/10/21 1607 20     Temp 09/10/21 1607 (!) 103 F (39.4 C)     Temp Source 09/10/21 1607 Oral     SpO2 09/10/21 1607 100 %     Weight 09/10/21 1603 185 lb (83.9 kg)     Height 09/10/21 1603 6\' 1"  (1.854 m)     Head Circumference --      Peak Flow --      Pain Score 09/10/21 1603 5     Pain Loc --      Pain Edu? --      Excl. in GC? --    No data found.  Updated Vital Signs BP 137/85   Pulse (!) 131   Temp (!) 101 F (38.3 C) (Oral)   Resp 20   Ht 6\' 1"  (1.854 m)   Wt 83.9 kg   SpO2 100%   BMI 24.41 kg/m      Physical Exam Constitutional:      General: He is not in acute distress.    Appearance: He is well-developed and normal weight. He is ill-appearing.  HENT:     Head: Normocephalic and atraumatic.     Right Ear: Tympanic membrane, ear canal and external ear normal.     Left Ear: Tympanic membrane, ear canal and external ear normal.     Nose: Nose normal. No congestion.     Mouth/Throat:     Mouth: Mucous  membranes are moist.     Pharynx: No posterior oropharyngeal erythema.  Eyes:     Conjunctiva/sclera: Conjunctivae normal.     Pupils: Pupils are equal, round, and reactive to light.  Cardiovascular:     Rate and Rhythm: Normal rate and regular rhythm.     Heart sounds: Normal heart sounds.  Pulmonary:     Effort: Pulmonary effort is normal. No respiratory distress.     Breath sounds: Normal breath sounds. No wheezing or rales.  Abdominal:     General: There is no distension.     Palpations: Abdomen is soft.  Musculoskeletal:        General: Normal range of motion.     Cervical back: Normal range of motion and neck supple.  Lymphadenopathy:     Cervical: No cervical adenopathy.  Skin:    General: Skin is warm.     Findings: No erythema or rash.     Comments: Skin warm and damp, febrile  Neurological:     Mental Status: He is alert.  Psychiatric:        Mood and Affect: Mood normal.        Behavior: Behavior normal.     UC Treatments / Results  Labs (all labs ordered are listed, but only abnormal results are displayed) Labs Reviewed  CULTURE, GROUP A STREP  COVID-19, FLU A+B AND RSV  POCT RAPID STREP A (OFFICE)    EKG   Radiology No results found.  Procedures Procedures (including critical care time)  Medications Ordered in UC Medications  acetaminophen (TYLENOL) tablet 650 mg (650 mg Oral Given 09/10/21 1606)    Initial Impression / Assessment and Plan / UC Course  I have reviewed the triage vital signs and the nursing notes.  Pertinent labs & imaging results that were available during my care of the patient were reviewed by me and considered in my medical decision making (see chart for details).     Rapid strep is negative.  COVID  testing and flu testing are pending.  Patient has a febrile illness with a fever of 103.  Symptomatic care reviewed. Final Clinical Impressions(s) / UC Diagnoses   Final diagnoses:  Fever, unspecified  Influenza-like illness      Discharge Instructions      Drink lots of fluids Take ibuprofen or Tylenol for pain and fever May take over-the-counter cough and cold medicine if needed Check MyChart for your test results    ED Prescriptions   None    PDMP not reviewed this encounter.   Eustace Moore, MD 09/11/21 [phone]

## 2021-09-12 LAB — COVID-19, FLU A+B AND RSV
Influenza A, NAA: NOT DETECTED
Influenza B, NAA: NOT DETECTED
RSV, NAA: NOT DETECTED
SARS-CoV-2, NAA: NOT DETECTED

## 2021-09-16 LAB — CULTURE, GROUP A STREP: Strep A Culture: NEGATIVE

## 2021-10-02 ENCOUNTER — Encounter: Payer: Self-pay | Admitting: Medical

## 2021-10-02 ENCOUNTER — Telehealth (INDEPENDENT_AMBULATORY_CARE_PROVIDER_SITE_OTHER): Payer: Federal, State, Local not specified - PPO | Admitting: Medical

## 2021-10-02 ENCOUNTER — Other Ambulatory Visit: Payer: Self-pay

## 2021-10-02 VITALS — Wt 179.0 lb

## 2021-10-02 DIAGNOSIS — J0110 Acute frontal sinusitis, unspecified: Secondary | ICD-10-CM | POA: Diagnosis not present

## 2021-10-02 DIAGNOSIS — H68013 Acute Eustachian salpingitis, bilateral: Secondary | ICD-10-CM | POA: Diagnosis not present

## 2021-10-02 MED ORDER — CLARITHROMYCIN 500 MG PO TABS: 500.0000 mg | ORAL_TABLET | Freq: Two times a day (BID) | ORAL | 0 refills | Status: DC

## 2021-10-02 NOTE — Progress Notes (Signed)
Subjective:     Patient ID: Elijah Rojas, male   DOB: 05/10/1981, 40 y.o.   MRN: [id number]  This visit type was conducted due to national recommendations for restrictions regarding the COVID-19 Pandemic (e.g. social distancing) in an effort to limit this patient's exposure and mitigate transmission in our community.  Due to their co-morbid illnesses, this patient is at least at moderate risk for complications without adequate follow up.  This format is felt to be most appropriate for this patient at this time.    Documentation for virtual audio and video telecommunications through [place] encounter:  The patient was located at home. The provider was located in the office. The patient did consent to this visit and is aware of possible charges through their insurance for this visit.  The other persons participating in this telemedicine service were none. Time spent on call was 20 minutes and in review of previous records 20 minutes total.  This virtual service is not related to other E/M service within previous 7 days.   HPI Chief Complaint  Patient presents with   Sinus Problem    Sinus pressure, ear fullness sensation, nasal drainage started 4 weeks ago on and off.   Virtual consult today for respiratory issues.  Initially a month ago he had bad sinus pressure, swollen tonsils, URI symptoms, had negative covid tests.   Since then some of those symptoms improved.  He notes ongoing several weeks of sinus pressure, ear pressure, post nasal drainage, scratchy throat.  Has fullness in neck eustachian tubes.  No fever, no NVD.  Has some cough clearing throat.  Not getting up colored mucous.  Currently using mucinex, claritin daily.  Hasn't been using Flonase but has it.   Has been on mucinex a full month now. No other aggravating or relieving factors. No other complaint.    Past Medical History:  Diagnosis Date   Allergy    Asthma    childhood, last flare worse age 77yo and younger    Migraine    diagnosed in 69s, none in a while, worse in 83s.  as of 07/2019   Current Outpatient Medications on File Prior to Visit  Medication Sig Dispense Refill   ibuprofen (ADVIL) 400 MG tablet Take 400 mg by mouth every 6 (six) hours as needed.     No current facility-administered medications on file prior to visit.     Review of Systems As in subjective    Objective:   Physical Exam Due to coronavirus pandemic stay at home measures, patient visit was virtual and they were not examined in person.   Wt 179 lb (81.2 kg)   BMI 23.62 kg/m   Gen: wd, wn, nad No labored breathing, no dyspnea, well appearing      Assessment:     Encounter Diagnoses  Name Primary?   Acute non-recurrent frontal sinusitis Yes   Eustachian salpingitis, acute, bilateral        Plan:     We discussed his concerns and symptoms.  I recommended he stop Mucinex.  Change to either Sudafed or for a week or can try Allegra-D or Claritin-D to help with the symptoms and mucus.  Increase water intake, consider nasal saline flush.  Consider the Flonase.  If not seeing improvements over the next week despite these efforts then begin Biaxin antibiotic below for likely sinus or ear infection call or recheck if not much improved or resolved in the next week  Keawe was seen today for sinus problem.  Diagnoses and all orders for this visit:  Acute non-recurrent frontal sinusitis  Eustachian salpingitis, acute, bilateral  Other orders -     clarithromycin (BIAXIN) 500 MG tablet; Take 1 tablet (500 mg total) by mouth 2 (two) times daily.   F/u prn

## 2021-10-08 DIAGNOSIS — G039 Meningitis, unspecified: Secondary | ICD-10-CM | POA: Diagnosis not present

## 2021-10-08 DIAGNOSIS — R000 Tachycardia, unspecified: Secondary | ICD-10-CM | POA: Diagnosis not present

## 2021-10-08 DIAGNOSIS — J9600 Acute respiratory failure, unspecified whether with hypoxia or hypercapnia: Secondary | ICD-10-CM | POA: Diagnosis not present

## 2021-10-08 DIAGNOSIS — G9341 Metabolic encephalopathy: Secondary | ICD-10-CM | POA: Diagnosis not present

## 2021-10-08 DIAGNOSIS — R590 Localized enlarged lymph nodes: Secondary | ICD-10-CM | POA: Diagnosis not present

## 2021-10-08 DIAGNOSIS — E876 Hypokalemia: Secondary | ICD-10-CM | POA: Diagnosis not present

## 2021-10-08 DIAGNOSIS — Z20822 Contact with and (suspected) exposure to covid-19: Secondary | ICD-10-CM | POA: Diagnosis not present

## 2021-10-08 DIAGNOSIS — D696 Thrombocytopenia, unspecified: Secondary | ICD-10-CM | POA: Diagnosis not present

## 2021-10-08 DIAGNOSIS — Z9911 Dependence on respirator [ventilator] status: Secondary | ICD-10-CM | POA: Diagnosis not present

## 2021-10-08 DIAGNOSIS — R509 Fever, unspecified: Secondary | ICD-10-CM | POA: Diagnosis not present

## 2021-10-08 DIAGNOSIS — R1310 Dysphagia, unspecified: Secondary | ICD-10-CM | POA: Diagnosis not present

## 2021-10-08 DIAGNOSIS — J9601 Acute respiratory failure with hypoxia: Secondary | ICD-10-CM | POA: Diagnosis not present

## 2021-10-08 DIAGNOSIS — R0603 Acute respiratory distress: Secondary | ICD-10-CM | POA: Diagnosis not present

## 2021-10-08 DIAGNOSIS — R569 Unspecified convulsions: Secondary | ICD-10-CM | POA: Diagnosis not present

## 2021-10-08 DIAGNOSIS — R404 Transient alteration of awareness: Secondary | ICD-10-CM | POA: Diagnosis not present

## 2021-10-08 DIAGNOSIS — G030 Nonpyogenic meningitis: Secondary | ICD-10-CM | POA: Diagnosis not present

## 2021-10-08 DIAGNOSIS — R0689 Other abnormalities of breathing: Secondary | ICD-10-CM | POA: Diagnosis not present

## 2021-10-08 DIAGNOSIS — R4182 Altered mental status, unspecified: Secondary | ICD-10-CM | POA: Diagnosis not present

## 2021-10-08 DIAGNOSIS — M542 Cervicalgia: Secondary | ICD-10-CM | POA: Diagnosis not present

## 2021-10-09 DIAGNOSIS — Z9911 Dependence on respirator [ventilator] status: Secondary | ICD-10-CM | POA: Diagnosis not present

## 2021-10-09 DIAGNOSIS — R4182 Altered mental status, unspecified: Secondary | ICD-10-CM | POA: Diagnosis not present

## 2021-10-09 DIAGNOSIS — J9600 Acute respiratory failure, unspecified whether with hypoxia or hypercapnia: Secondary | ICD-10-CM | POA: Diagnosis not present

## 2021-10-09 DIAGNOSIS — Z452 Encounter for adjustment and management of vascular access device: Secondary | ICD-10-CM | POA: Diagnosis not present

## 2021-10-09 DIAGNOSIS — Z4682 Encounter for fitting and adjustment of non-vascular catheter: Secondary | ICD-10-CM | POA: Diagnosis not present

## 2021-10-09 DIAGNOSIS — G9341 Metabolic encephalopathy: Secondary | ICD-10-CM | POA: Diagnosis not present

## 2021-10-09 DIAGNOSIS — G039 Meningitis, unspecified: Secondary | ICD-10-CM | POA: Diagnosis not present

## 2021-10-09 DIAGNOSIS — R0689 Other abnormalities of breathing: Secondary | ICD-10-CM | POA: Diagnosis not present

## 2021-10-09 DIAGNOSIS — R509 Fever, unspecified: Secondary | ICD-10-CM | POA: Diagnosis not present

## 2021-10-09 DIAGNOSIS — R569 Unspecified convulsions: Secondary | ICD-10-CM | POA: Diagnosis not present

## 2021-10-09 DIAGNOSIS — R590 Localized enlarged lymph nodes: Secondary | ICD-10-CM | POA: Diagnosis not present

## 2021-10-10 DIAGNOSIS — R509 Fever, unspecified: Secondary | ICD-10-CM | POA: Diagnosis not present

## 2021-10-10 DIAGNOSIS — D696 Thrombocytopenia, unspecified: Secondary | ICD-10-CM | POA: Diagnosis not present

## 2021-10-10 DIAGNOSIS — R7881 Bacteremia: Secondary | ICD-10-CM | POA: Diagnosis not present

## 2021-10-10 DIAGNOSIS — R0689 Other abnormalities of breathing: Secondary | ICD-10-CM | POA: Diagnosis not present

## 2021-10-10 DIAGNOSIS — R569 Unspecified convulsions: Secondary | ICD-10-CM | POA: Diagnosis not present

## 2021-10-10 DIAGNOSIS — G039 Meningitis, unspecified: Secondary | ICD-10-CM | POA: Diagnosis not present

## 2021-10-10 DIAGNOSIS — R4182 Altered mental status, unspecified: Secondary | ICD-10-CM | POA: Diagnosis not present

## 2021-10-10 DIAGNOSIS — J9600 Acute respiratory failure, unspecified whether with hypoxia or hypercapnia: Secondary | ICD-10-CM | POA: Diagnosis not present

## 2021-10-10 DIAGNOSIS — R590 Localized enlarged lymph nodes: Secondary | ICD-10-CM | POA: Diagnosis not present

## 2021-10-10 DIAGNOSIS — G9341 Metabolic encephalopathy: Secondary | ICD-10-CM | POA: Diagnosis not present

## 2021-10-10 DIAGNOSIS — J9601 Acute respiratory failure with hypoxia: Secondary | ICD-10-CM | POA: Diagnosis not present

## 2021-10-11 DIAGNOSIS — B958 Unspecified staphylococcus as the cause of diseases classified elsewhere: Secondary | ICD-10-CM | POA: Diagnosis not present

## 2021-10-11 DIAGNOSIS — R569 Unspecified convulsions: Secondary | ICD-10-CM | POA: Diagnosis not present

## 2021-10-11 DIAGNOSIS — G039 Meningitis, unspecified: Secondary | ICD-10-CM | POA: Diagnosis not present

## 2021-10-11 DIAGNOSIS — R7881 Bacteremia: Secondary | ICD-10-CM | POA: Diagnosis not present

## 2021-10-11 DIAGNOSIS — J9601 Acute respiratory failure with hypoxia: Secondary | ICD-10-CM | POA: Diagnosis not present

## 2021-10-11 DIAGNOSIS — G9340 Encephalopathy, unspecified: Secondary | ICD-10-CM | POA: Diagnosis not present

## 2021-10-12 DIAGNOSIS — R569 Unspecified convulsions: Secondary | ICD-10-CM | POA: Diagnosis not present

## 2021-10-12 DIAGNOSIS — G9340 Encephalopathy, unspecified: Secondary | ICD-10-CM | POA: Diagnosis not present

## 2021-10-12 DIAGNOSIS — B958 Unspecified staphylococcus as the cause of diseases classified elsewhere: Secondary | ICD-10-CM | POA: Diagnosis not present

## 2021-10-12 DIAGNOSIS — R4182 Altered mental status, unspecified: Secondary | ICD-10-CM | POA: Diagnosis not present

## 2021-10-12 DIAGNOSIS — G039 Meningitis, unspecified: Secondary | ICD-10-CM | POA: Diagnosis not present

## 2021-10-12 DIAGNOSIS — R7881 Bacteremia: Secondary | ICD-10-CM | POA: Diagnosis not present

## 2021-10-13 DIAGNOSIS — J9601 Acute respiratory failure with hypoxia: Secondary | ICD-10-CM | POA: Diagnosis not present

## 2021-10-13 DIAGNOSIS — B958 Unspecified staphylococcus as the cause of diseases classified elsewhere: Secondary | ICD-10-CM | POA: Diagnosis not present

## 2021-10-13 DIAGNOSIS — R569 Unspecified convulsions: Secondary | ICD-10-CM | POA: Diagnosis not present

## 2021-10-13 DIAGNOSIS — R7881 Bacteremia: Secondary | ICD-10-CM | POA: Diagnosis not present

## 2021-10-14 DIAGNOSIS — B958 Unspecified staphylococcus as the cause of diseases classified elsewhere: Secondary | ICD-10-CM | POA: Diagnosis not present

## 2021-10-14 DIAGNOSIS — R4182 Altered mental status, unspecified: Secondary | ICD-10-CM | POA: Diagnosis not present

## 2021-10-14 DIAGNOSIS — G039 Meningitis, unspecified: Secondary | ICD-10-CM | POA: Diagnosis not present

## 2021-10-14 DIAGNOSIS — R7881 Bacteremia: Secondary | ICD-10-CM | POA: Diagnosis not present

## 2021-10-14 DIAGNOSIS — R569 Unspecified convulsions: Secondary | ICD-10-CM | POA: Diagnosis not present

## 2021-10-14 DIAGNOSIS — J9601 Acute respiratory failure with hypoxia: Secondary | ICD-10-CM | POA: Diagnosis not present

## 2021-10-15 DIAGNOSIS — R569 Unspecified convulsions: Secondary | ICD-10-CM | POA: Diagnosis not present

## 2021-10-15 DIAGNOSIS — G9340 Encephalopathy, unspecified: Secondary | ICD-10-CM | POA: Diagnosis not present

## 2021-10-15 DIAGNOSIS — J9601 Acute respiratory failure with hypoxia: Secondary | ICD-10-CM | POA: Diagnosis not present

## 2021-10-15 DIAGNOSIS — R7881 Bacteremia: Secondary | ICD-10-CM | POA: Diagnosis not present

## 2021-10-16 DIAGNOSIS — R4182 Altered mental status, unspecified: Secondary | ICD-10-CM | POA: Diagnosis not present

## 2021-10-16 DIAGNOSIS — J9601 Acute respiratory failure with hypoxia: Secondary | ICD-10-CM | POA: Diagnosis not present

## 2021-10-16 DIAGNOSIS — G039 Meningitis, unspecified: Secondary | ICD-10-CM | POA: Diagnosis not present

## 2021-10-16 DIAGNOSIS — R7881 Bacteremia: Secondary | ICD-10-CM | POA: Diagnosis not present

## 2021-10-17 ENCOUNTER — Telehealth: Payer: Self-pay

## 2021-10-17 NOTE — Telephone Encounter (Signed)
Transition Care Management Follow-up Telephone Call Date of discharge and from where: [facility] on 10/16/21 How have you been since you were released from the hospital? [facility] Any questions or concerns? No  Items Reviewed: Did the pt receive and understand the discharge instructions provided? Yes  Medications obtained and verified? Yes  Other? No  Any new allergies since your discharge? No  Dietary orders reviewed? No Do you have support at home? Yes   Home Care and Equipment/Supplies: Were home health services ordered? not applicable   Functional Questionnaire: (I = Independent and D = Dependent) ADLs: I  Bathing/Dressing- I  Meal Prep- I  Eating- I  Maintaining continence- I  Transferring/Ambulation- I  Managing Meds- I  Follow up appointments reviewed:  PCP Hospital f/u appt confirmed? Yes  Scheduled to see Kristian Covey on 10/22/21 @ 10;30. Specialist Hospital f/u appt confirmed? No   Are transportation arrangements needed? No  If their condition worsens, is the pt aware to call PCP or go to the Emergency Dept.? Yes Was the patient provided with contact information for the PCP's office or ED? Yes Was to pt encouraged to call back with questions or concerns? Yes

## 2021-10-18 ENCOUNTER — Telehealth: Payer: Self-pay | Admitting: Medical

## 2021-10-18 DIAGNOSIS — R4182 Altered mental status, unspecified: Secondary | ICD-10-CM | POA: Diagnosis not present

## 2021-10-18 NOTE — Telephone Encounter (Signed)
Pt. Called back and stated that it all started on 09/10/21 had a really high fever 103 he got better from that a few days later. Then he got worse again when he had the virtual apt. With you on 10/02/21. He said he got worse after that apt. And that's when he had to go to the hospital on 10/08/21. He stats he doesn't remember much about being in the hospital he was so out of it. He only remembers the last two days there. He said he was in the ICU for about 8 days and had to be put on a ventilator for a while. He stats he is doing better today but still has some weakness and fatigue.

## 2021-10-18 NOTE — Telephone Encounter (Signed)
Please call patient back.  I received notice about his recent hospitalization.  Elijah Rojas, I know you talked with him briefly yesterday  He had quite a significant issue with this hospitalization.  From what I saw through the records, did they feel this was a viral meningitis or some other organism they ultimately were concerned about?  I had a virtual consult with him recently as well.  Did he get better from that or did things get worse after that?  I hope he is feeling much better at this point

## 2021-10-18 NOTE — Telephone Encounter (Signed)
Called pt. LM to call back to see how he is doing.

## 2021-10-22 ENCOUNTER — Inpatient Hospital Stay: Payer: Federal, State, Local not specified - PPO | Admitting: Medical

## 2021-10-22 DIAGNOSIS — Z7409 Other reduced mobility: Secondary | ICD-10-CM | POA: Diagnosis not present

## 2021-10-23 ENCOUNTER — Other Ambulatory Visit: Payer: Self-pay

## 2021-10-23 ENCOUNTER — Ambulatory Visit (INDEPENDENT_AMBULATORY_CARE_PROVIDER_SITE_OTHER): Payer: Federal, State, Local not specified - PPO | Admitting: Medical

## 2021-10-23 VITALS — BP 120/70 | HR 67 | Wt 172.4 lb

## 2021-10-23 DIAGNOSIS — R4182 Altered mental status, unspecified: Secondary | ICD-10-CM | POA: Insufficient documentation

## 2021-10-23 DIAGNOSIS — Z9289 Personal history of other medical treatment: Secondary | ICD-10-CM

## 2021-10-23 DIAGNOSIS — Z7409 Other reduced mobility: Secondary | ICD-10-CM

## 2021-10-23 DIAGNOSIS — R454 Irritability and anger: Secondary | ICD-10-CM

## 2021-10-23 DIAGNOSIS — Q159 Congenital malformation of eye, unspecified: Secondary | ICD-10-CM

## 2021-10-23 DIAGNOSIS — Z8661 Personal history of infections of the central nervous system: Secondary | ICD-10-CM

## 2021-10-23 DIAGNOSIS — R41840 Attention and concentration deficit: Secondary | ICD-10-CM

## 2021-10-23 NOTE — Progress Notes (Signed)
Subjective:  Elijah Rojas is a 40 y.o. male who presents for Chief Complaint  Patient presents with   meningtitis    Meningitis, discharged on October 18th. Still having some symptoms.      Here for hospital follow-up  He was admitted to the hospital October 10 through October 18 for altered mental status, seizure, meningitis and hypocalcemia.  He presented with fever, felt unwell, 10 out of 10 neck pain, muscle aches throughout, nausea and vomiting, was seen in the emergency department initially and had lumbar puncture performed.  He was transferred to the ICU and admitted.  Had acute respiratory failure with hypoxia, was intubated.  Had consult with neurology.  Was stared on Keppra, famotidine for discharge  He has had dizziness with speech therapy and physical therapy.  Speech therapy released him.  Physical therapy felt like he did not need to come back.  He saw them yesterday.  He has neurology follow-up posthospitalization on Monday next week  He saw physical therapy for impaired mobility but he feels as if his balance and mobility is fine at this point.  His symptoms at this point include ongoing headache, some irritability, ears feel full, and still has some concerns with concentration.  He tried to get online and look at some forms today and felt not himself, felt somewhat frustrated and irritable trying to complete those.  He denies numbness, tingling, fever, no nausea or vomiting, no dizziness, no longer has stiff neck.  Before he was hospitalized, we did a virtual visit 10/02/21 for eustachian tube and sinusitis symptoms.  Over the course of that week he just got worse to the point of febrile and altered mental status.  Prior to our virtual visit he had several weeks of feeling congested and sinus pressure, without fever.  He had been seen about a month before my virtual visit with him in urgent care and had a bunch of test including RSV, COVID and other testing that day that was  negative.  Looking back he notes that leading up to this hospitalization he was under a lot of stress.  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  He works at [employer] shifts every 6 weeks.  He has a Psychologist, prison and probation services that has put a lot of pressure on him and his other team members about education and requirements.  The other 2 directors he has had were nowhere near as pressured as this director so that is causing some stress  His other job is more stressful.  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  Lately he has been charged with heading up a flu clinic, he has been involved in re certifying the chemotherapy program with oncology services, he has been dealing with rollout of the by [facility] COVID booster, he also is involved with expanding outpatient blood services.  In addition to those multiple roles he had to participate in the investigation.  There was a another Facilities manager that was being investigated which required a whole in-depth screening questionnaires and interviews with multiple people in the department including him.  This was added stress that was out of the ordinary.  He thinks all the stress was a factor in him getting sick and being hospitalized.  While while in the hospital he had an NG tube and endotracheal tube.  He still has little irritation in his mouth from that.  No travel leading up to this hospitalization  Regarding left eye noticed on  exam today he notes history of astigmatism and chronic problems of the eye.  No current pain or cloudiness or blurred vision.  No other aggravating or relieving factors.    No other c/o.  Past Medical History:  Diagnosis Date   Allergy    Asthma    childhood, last flare worse age 7yo and younger   Migraine    diagnosed in 18s, none in a while, worse in 83s.  as of 07/2019   Current Outpatient Medications on File Prior to Visit  Medication Sig Dispense Refill   famotidine (PEPCID) 20 MG tablet Take  20 mg by mouth 2 (two) times daily.     ibuprofen (ADVIL) 400 MG tablet Take 400 mg by mouth every 6 (six) hours as needed.     levETIRAcetam (KEPPRA) 500 MG tablet Take 500 mg by mouth 2 (two) times daily.     No current facility-administered medications on file prior to visit.    The following portions of the patient's history were reviewed and updated as appropriate: allergies, current medications, past family history, past medical history, past social history, past surgical history and problem list.  ROS Otherwise as in subjective above    Objective: BP 120/70   Pulse 67   Wt 172 lb 6.4 oz (78.2 kg)   BMI 22.75 kg/m   General appearance: alert, no distress, well developed, well nourished There is abnormal light reflex of the left and cloudiness of the pupil abnormal appearing, there also seems to be a slight malalignment of the left eye compared to the right.  Not sure if this is new or not.  He notes a history of astigmatism and possible motion problems in the past.  Otherwise  PERRLA, EOMI HEENT: normocephalic, sclerae anicteric, conjunctiva pink and moist, TMs pearly, nares patent, no discharge or erythema, pharynx normal Oral cavity: MMM, no lesions Neck: supple, no lymphadenopathy, no thyromegaly, no masses, no bruits Heart: RRR, normal S1, S2, no murmurs Lungs: CTA bilaterally, no wheezes, rhonchi, or rales Abdomen: +bs, soft, non tender, non distended, no masses, no hepatomegaly, no splenomegaly Pulses: 2+ radial pulses, 2+ pedal pulses, normal cap refill Ext: no edema Neuro: No obvious deficit today CN II through XII intact, normal heel-to-toe walk, normal finger-to-nose Alert and oriented x3, psych: Pleasant, answers questions appropriately conversing appropriately    Assessment: Encounter Diagnoses  Name Primary?   Altered mental status, unspecified altered mental status type Yes   History of meningitis    History of recent hospitalization    Impaired mobility     Eye abnormality    Irritability    Concentration deficit      Plan: Hospitalization notes and discharge summary reviewed.   Medications reconciled  I reviewed numerous hospitalization records, labs, imaging.  He has abnormal eye exam today in terms of the pupil and light reflex.  I advise he see eye doctor soon for follow-up.  He is going to check into establishing eye doctor.  He declined referral today  He has follow-up with neurology in about 6 days.  I recommended considering occupational therapy and may be going back for another physical therapy visit  We discussed the need for complete rest this week until he sees neurology.  I advise no significant computer use or other stimuli.  He needs to just use complete rest but his body and brain recover.  Advised healthy diet, good fluid intake and can do some light exercise such as walking but not much more than that.  Elijah Rojas was seen today for meningtitis.  Diagnoses and all orders for this visit:  Altered mental status, unspecified altered mental status type  History of meningitis  History of recent hospitalization  Impaired mobility  Eye abnormality  Irritability  Concentration deficit   Follow up: with neurology in 6 days as scheduled

## 2021-10-26 ENCOUNTER — Encounter: Payer: Self-pay | Admitting: Medical

## 2021-10-29 DIAGNOSIS — Z79899 Other long term (current) drug therapy: Secondary | ICD-10-CM | POA: Diagnosis not present

## 2021-10-29 DIAGNOSIS — R569 Unspecified convulsions: Secondary | ICD-10-CM | POA: Diagnosis not present

## 2021-10-30 DIAGNOSIS — Z79899 Other long term (current) drug therapy: Secondary | ICD-10-CM | POA: Diagnosis not present

## 2021-11-02 DIAGNOSIS — Z789 Other specified health status: Secondary | ICD-10-CM | POA: Diagnosis not present

## 2021-11-02 DIAGNOSIS — R29898 Other symptoms and signs involving the musculoskeletal system: Secondary | ICD-10-CM | POA: Diagnosis not present

## 2021-11-08 DIAGNOSIS — Z789 Other specified health status: Secondary | ICD-10-CM | POA: Diagnosis not present

## 2021-11-08 DIAGNOSIS — R29898 Other symptoms and signs involving the musculoskeletal system: Secondary | ICD-10-CM | POA: Diagnosis not present

## 2021-11-15 DIAGNOSIS — Z789 Other specified health status: Secondary | ICD-10-CM | POA: Diagnosis not present

## 2021-11-15 DIAGNOSIS — R29898 Other symptoms and signs involving the musculoskeletal system: Secondary | ICD-10-CM | POA: Diagnosis not present

## 2021-11-19 DIAGNOSIS — Z79899 Other long term (current) drug therapy: Secondary | ICD-10-CM | POA: Diagnosis not present

## 2021-11-23 DIAGNOSIS — M79661 Pain in right lower leg: Secondary | ICD-10-CM | POA: Diagnosis not present

## 2021-11-23 DIAGNOSIS — M79604 Pain in right leg: Secondary | ICD-10-CM | POA: Diagnosis not present

## 2021-11-23 DIAGNOSIS — R2241 Localized swelling, mass and lump, right lower limb: Secondary | ICD-10-CM | POA: Diagnosis not present

## 2021-11-23 DIAGNOSIS — S86891A Other injury of other muscle(s) and tendon(s) at lower leg level, right leg, initial encounter: Secondary | ICD-10-CM | POA: Diagnosis not present

## 2021-11-23 DIAGNOSIS — M7989 Other specified soft tissue disorders: Secondary | ICD-10-CM | POA: Diagnosis not present

## 2021-12-30 DIAGNOSIS — R569 Unspecified convulsions: Secondary | ICD-10-CM

## 2021-12-30 HISTORY — DX: Unspecified convulsions: R56.9

## 2022-01-15 ENCOUNTER — Encounter: Payer: Self-pay | Admitting: Internal Medicine

## 2022-01-22 ENCOUNTER — Ambulatory Visit: Payer: Federal, State, Local not specified - PPO | Admitting: Medical

## 2022-01-22 ENCOUNTER — Other Ambulatory Visit: Payer: Self-pay

## 2022-01-22 ENCOUNTER — Encounter: Payer: Self-pay | Admitting: Medical

## 2022-01-22 ENCOUNTER — Ambulatory Visit
Admission: RE | Admit: 2022-01-22 | Discharge: 2022-01-22 | Disposition: A | Discharge status: Home / Self Care | Payer: Federal, State, Local not specified - PPO | Source: Ambulatory Visit | Attending: Medical | Admitting: Medical

## 2022-01-22 VITALS — BP 130/80 | HR 56 | Ht 73.0 in | Wt 178.2 lb

## 2022-01-22 DIAGNOSIS — R454 Irritability and anger: Secondary | ICD-10-CM

## 2022-01-22 DIAGNOSIS — R7989 Other specified abnormal findings of blood chemistry: Secondary | ICD-10-CM

## 2022-01-22 DIAGNOSIS — Z0000 Encounter for general adult medical examination without abnormal findings: Secondary | ICD-10-CM | POA: Diagnosis not present

## 2022-01-22 DIAGNOSIS — Z13220 Encounter for screening for lipoid disorders: Secondary | ICD-10-CM

## 2022-01-22 DIAGNOSIS — R946 Abnormal results of thyroid function studies: Secondary | ICD-10-CM

## 2022-01-22 DIAGNOSIS — Q159 Congenital malformation of eye, unspecified: Secondary | ICD-10-CM

## 2022-01-22 DIAGNOSIS — M545 Low back pain, unspecified: Secondary | ICD-10-CM

## 2022-01-22 DIAGNOSIS — Z8661 Personal history of infections of the central nervous system: Secondary | ICD-10-CM

## 2022-01-22 NOTE — Patient Instructions (Signed)
Please go to [facility] for your lumbar xray.   Their hours are 8am - 4:30 pm Monday - Friday.  Take your insurance card with you.  [facility] [phone]  [address], [address] [place], [place] [address]  [address] [place], [place] [address] 

## 2022-01-22 NOTE — Progress Notes (Signed)
Subjective:   HPI  Elijah Rojas is a 41 y.o. male who presents for Chief Complaint  Patient presents with   fasting cpe    Fasting cpe. Sometimes when bending over for long periods of time he will start having back pain. , no other concerns    Patient Care Team: Gionna Polak, Cleda Mccreedy as PCP - General (Family Medicine) Sees dentist Sees eye doctor Dr. Hubbard Hartshorn, ophthalmology Dr. Quinn Plowman, neurology   Concerns: Sees neurology regularly of late, s/p hx/o meningitis and seizure in recent months.   Hospitalization in 09/2021.  Back to work full time, but coworkers say he can be irritable.  Not current seeing counseling.  Can't drive until April due to seizure.    Saw ophthalmology recent at [facility].   Was advised of corneal issues, will be seeing specialist soon for this.    Has intermittent low back pain centrally in lumbar area.  No injury.   Is active, lifts weights regularly, cardio regularly, does a lot of yard work and projects around the house.   Reviewed their medical, surgical, family, social, medication, and allergy history and updated chart as appropriate.  Past Medical History:  Diagnosis Date   Allergy    Asthma    childhood, last flare worse age 37yo and younger   Migraine    diagnosed in 15s, none in a while, worse in 75s.  as of 07/2019    Past Surgical History:  Procedure Laterality Date   WISDOM TOOTH EXTRACTION      Family History  Problem Relation Age of Onset   Arthritis Mother    Sjogren's syndrome Mother        seropositive   Osteoporosis Mother    Hypertension Father    Cancer Father        tonsillar cancer, possible HPV related   Heart disease Maternal Grandmother    COPD Maternal Grandmother    Hypertension Maternal Grandfather    Cancer Maternal Grandfather        colon   Hypertension Paternal Grandmother    Kidney disease Paternal Grandmother        dialysis   Cancer Paternal Grandmother        breast   Cancer  Maternal Aunt        colon   Diabetes Paternal Grandfather      Current Outpatient Medications:    ibuprofen (ADVIL) 400 MG tablet, Take 400 mg by mouth every 6 (six) hours as needed., Disp: , Rfl:    levETIRAcetam (KEPPRA) 750 MG tablet, Take 750 mg by mouth 2 (two) times daily., Disp: , Rfl:    Loratadine (CLARITIN PO), Take by mouth., Disp: , Rfl:    pyridoxine (B-6) 100 MG tablet, Take by mouth., Disp: , Rfl:   Allergies  Allergen Reactions   Amoxicillin Other (See Comments)    Upset stomach     Review of Systems Constitutional: -fever, -chills, -sweats, -unexpected weight change, -decreased appetite, -fatigue Allergy: -sneezing, -itching, -congestion Dermatology: -changing moles, --rash, -lumps ENT: -runny nose, -ear pain, -sore throat, -hoarseness, -sinus pain, -teeth pain, - ringing in ears, -hearing loss, -nosebleeds Cardiology: -chest pain, -palpitations, -swelling, -difficulty breathing when lying flat, -waking up short of breath Respiratory: -cough, -shortness of breath, -difficulty breathing with exercise or exertion, -wheezing, -coughing up blood Gastroenterology: -abdominal pain, -nausea, -vomiting, -diarrhea, -constipation, -blood in stool, -changes in bowel movement, -difficulty swallowing or eating Hematology: -bleeding, -bruising  Musculoskeletal: -joint aches, -muscle aches, -joint swelling, +back pain, -  neck pain, -cramping, -changes in gait Ophthalmology: denies vision changes, eye redness, itching, discharge Urology: -burning with urination, -difficulty urinating, -blood in urine, -urinary frequency, -urgency, -incontinence Neurology: -headache, -weakness, -tingling, -numbness, -memory loss, -falls, -dizziness Psychology: -depressed mood, +agitation, -sleep problems Male GU: no testicular mass, pain, no lymph nodes swollen, no swelling, no rash.  [facility] 01/22/2022 10/23/2021 01/19/2021 08/04/2019  Decreased Interest 0 0 0 0  Down, Depressed,  Hopeless 0 0 0 0  PHQ - 2 Score 0 0 0 0        Objective:  BP 130/80    Pulse (!) 56    Ht 6\' 1"  (1.854 m)    Wt 178 lb 3.2 oz (80.8 kg)    BMI 23.51 kg/m   General appearance: alert, no distress, WD/WN Skin: unremarkable HEENT: normocephalic, conjunctiva normal, abnormality of left cornea, sclerae anicteric, PERRLA, EOMi Neck: supple, no lymphadenopathy, no thyromegaly, no masses, normal ROM, no bruits Chest: non tender, normal shape and expansion Heart: RRR, normal S1, S2, no murmurs Lungs: CTA bilaterally, no wheezes, rhonchi, or rales Abdomen: +bs, soft, non tender, non distended, no masses, no hepatomegaly, no splenomegaly, no bruits Back: non tender, normal ROM, no scoliosis, no obvious deformity but he notes pain in low mid back  Musculoskeletal: upper extremities non tender, no obvious deformity, normal ROM throughout, lower extremities non tender, no obvious deformity, normal ROM throughout Extremities: no edema, no cyanosis, no clubbing Pulses: 2+ symmetric, upper and lower extremities, normal cap refill Neurological: alert, oriented x 3, CN2-12 intact, strength normal upper extremities and lower extremities, sensation normal throughout, DTRs 2+ throughout, no cerebellar signs, gait normal Psychiatric: normal affect, behavior normal, pleasant  GU: normal male external genitalia,uncircumcised, nontender, no masses, no hernia, no lymphadenopathy Rectal: deferred   Assessment and Plan :   Encounter Diagnoses  Name Primary?   Encounter for health maintenance examination in adult Yes   Abnormal thyroid blood test    Screening for lipid disorders    Irritability    Abnormal thyroid exam    Eye abnormality    History of meningitis    Low back pain without sciatica, unspecified back pain laterality, unspecified chronicity     This visit was a preventative care visit, also known as wellness visit or routine physical.   Topics typically include healthy lifestyle, diet,  exercise, preventative care, vaccinations, sick and well care, proper use of emergency dept and after hours care, as well as other concerns.     Recommendations: Continue to return yearly for your annual wellness and preventative care visits.  This gives us a chance to discuss healthy lifestyle, exercise, vaccinations, review your chart record, and perform screenings where appropriate.  I recommend you see your eye doctor yearly for routine vision care.  I recommend you see your dentist yearly for routine dental care including hygiene visits twice yearly.   Vaccination recommendations were reviewed Immunization History  Administered Date(s) Administered   Influenza-Unspecified 10/26/2020, 11/26/2021   Moderna Sars-Covid-2 Vaccination 12/29/2019, 01/26/2020, 11/02/2020   Td 01/19/2021   Tdap 06/19/2010   Advised yearly flu shot   Screening for cancer: Colon cancer screening: Age 445  Testicular cancer screening You should do a monthly self testicular exam if you are between 6220-40 years old  We discussed PSA, prostate exam, and prostate cancer screening risks/benefits.   Age 40  Skin cancer screening: Check your skin regularly for new changes, growing lesions, or other lesions of concern Come in for evaluation if  you have skin lesions of concern.  Lung cancer screening: If you have a greater than 20 pack year history of tobacco use, then you may qualify for lung cancer screening with a chest CT scan.   Please call your insurance company to inquire about coverage for this test.  We currently don't have screenings for other cancers besides breast, cervical, colon, and lung cancers.  If you have a strong family history of cancer or have other cancer screening concerns, please let me know.    Bone health: Get at least 150 minutes of aerobic exercise weekly Get weight bearing exercise at least once weekly Bone density test:  A bone density test is an imaging test that uses a type  of X-ray to measure the amount of calcium and other minerals in your bones. The test may be used to diagnose or screen you for a condition that causes weak or thin bones (osteoporosis), predict your risk for a broken bone (fracture), or determine how well your osteoporosis treatment is working. The bone density test is recommended for females 65 and older, or females or males <65 if certain risk factors such as thyroid disease, long term use of steroids such as for asthma or rheumatological issues, vitamin D deficiency, estrogen deficiency, family history of osteoporosis, self or family history of fragility fracture in first degree relative.    Heart health: Get at least 150 minutes of aerobic exercise weekly Limit alcohol It is important to maintain a healthy blood pressure and healthy cholesterol numbers  Heart disease screening: Screening for heart disease includes screening for blood pressure, fasting lipids, glucose/diabetes screening, BMI height to weight ratio, reviewed of smoking status, physical activity, and diet.    Goals include blood pressure 120/80 or less, maintaining a healthy lipid/cholesterol profile, preventing diabetes or keeping diabetes numbers under good control, not smoking or using tobacco products, exercising most days per week or at least 150 minutes per week of exercise, and eating healthy variety of fruits and vegetables, healthy oils, and avoiding unhealthy food choices like fried food, fast food, high sugar and high cholesterol foods.    Other tests may possibly include EKG test, CT coronary calcium score, echocardiogram, exercise treadmill stress test.     Medical care options: I recommend you continue to seek care here first for routine care.  We try really hard to have available appointments Monday through Friday daytime hours for sick visits, acute visits, and physicals.  Urgent care should be used for after hours and weekends for significant issues that cannot  wait till the next day.  The emergency department should be used for significant potentially life-threatening emergencies.  The emergency department is expensive, can often have long wait times for less significant concerns, so try to utilize primary care, urgent care, or telemedicine when possible to avoid unnecessary trips to the emergency department.  Virtual visits and telemedicine have been introduced since the pandemic started in 2020, and can be convenient ways to receive medical care.  We offer virtual appointments as well to assist you in a variety of options to seek medical care.    Separate significant issues discussed: Low back pain, focal - he will go for xray for baseline evaluation  Seizures - managed by neurology, sees them again soon  Hx/o meninginitis - October hospitalization, resolved.  Irritability, stress, anger concerns - advised counseling.  He will start with employee assistance program for counseling.  Eye abnormality of cornea - f/u with ophthalmology as planned  Hx/o  abnormal thyroid labs - updated labs today.  Normal labs last year    Elijah Rojas was seen today for fasting cpe.  Diagnoses and all orders for this visit:  Encounter for health maintenance examination in adult -     Lipid panel -     TSH -     VITAMIN D 25 Hydroxy (Vit-D Deficiency, Fractures) -     T4, free -     T3 -     Glucose, Random  Abnormal thyroid blood test  Screening for lipid disorders -     Lipid panel  Irritability  Abnormal thyroid exam -     TSH -     T4, free -     T3  Eye abnormality  History of meningitis  Low back pain without sciatica, unspecified back pain laterality, unspecified chronicity -     DG Lumbar Spine Complete; Future   Follow-up pending labs, yearly for physical

## 2022-01-23 LAB — LIPID PANEL
Chol/HDL Ratio: 2.2 ratio (ref 0.0–5.0)
Cholesterol, Total: 137 mg/dL (ref 100–199)
HDL: 61 mg/dL (ref >39)
LDL Chol Calc (NIH): 66 mg/dL (ref 0–99)
Triglycerides: 39 mg/dL (ref 0–149)
VLDL Cholesterol Cal: 10 mg/dL (ref 5–40)

## 2022-01-23 LAB — VITAMIN D 25 HYDROXY (VIT D DEFICIENCY, FRACTURES): Vit D, 25-Hydroxy: 34.9 ng/mL (ref 30.0–100.0)

## 2022-01-23 LAB — T4, FREE: Free T4: 1.32 ng/dL (ref 0.82–1.77)

## 2022-01-23 LAB — TSH: TSH: 0.746 u[IU]/mL (ref 0.450–4.500)

## 2022-01-23 LAB — GLUCOSE, RANDOM: Glucose: 105 mg/dL — ABNORMAL HIGH (ref 70–99)

## 2022-01-23 LAB — T3: T3, Total: 114 ng/dL (ref 71–180)

## 2022-01-24 DIAGNOSIS — R569 Unspecified convulsions: Secondary | ICD-10-CM | POA: Diagnosis not present

## 2022-07-03 DIAGNOSIS — H1712 Central corneal opacity, left eye: Secondary | ICD-10-CM | POA: Diagnosis not present

## 2022-07-03 DIAGNOSIS — H18603 Keratoconus, unspecified, bilateral: Secondary | ICD-10-CM | POA: Diagnosis not present

## 2022-07-24 ENCOUNTER — Other Ambulatory Visit (INDEPENDENT_AMBULATORY_CARE_PROVIDER_SITE_OTHER): Payer: Federal, State, Local not specified - PPO

## 2022-07-24 DIAGNOSIS — Z23 Encounter for immunization: Secondary | ICD-10-CM | POA: Diagnosis not present

## 2022-07-24 DIAGNOSIS — R569 Unspecified convulsions: Secondary | ICD-10-CM | POA: Diagnosis not present

## 2022-09-04 ENCOUNTER — Encounter: Payer: Self-pay | Admitting: Internal Medicine

## 2022-09-27 ENCOUNTER — Other Ambulatory Visit (INDEPENDENT_AMBULATORY_CARE_PROVIDER_SITE_OTHER): Payer: Federal, State, Local not specified - PPO

## 2022-09-27 DIAGNOSIS — Z23 Encounter for immunization: Secondary | ICD-10-CM | POA: Diagnosis not present

## 2022-09-27 DIAGNOSIS — Z7185 Encounter for immunization safety counseling: Secondary | ICD-10-CM

## 2022-10-08 ENCOUNTER — Encounter: Payer: Self-pay | Admitting: Internal Medicine

## 2022-10-16 DIAGNOSIS — H1712 Central corneal opacity, left eye: Secondary | ICD-10-CM | POA: Diagnosis not present

## 2022-10-16 DIAGNOSIS — H18603 Keratoconus, unspecified, bilateral: Secondary | ICD-10-CM | POA: Diagnosis not present

## 2023-01-07 ENCOUNTER — Encounter: Payer: Self-pay | Admitting: Internal Medicine

## 2023-01-09 DIAGNOSIS — H18603 Keratoconus, unspecified, bilateral: Secondary | ICD-10-CM | POA: Diagnosis not present

## 2023-01-09 DIAGNOSIS — Z8661 Personal history of infections of the central nervous system: Secondary | ICD-10-CM | POA: Diagnosis not present

## 2023-01-09 DIAGNOSIS — H18312 Folds and rupture in Bowman's membrane, left eye: Secondary | ICD-10-CM | POA: Diagnosis not present

## 2023-01-09 DIAGNOSIS — H1712 Central corneal opacity, left eye: Secondary | ICD-10-CM | POA: Diagnosis not present

## 2023-01-09 DIAGNOSIS — H179 Unspecified corneal scar and opacity: Secondary | ICD-10-CM | POA: Diagnosis not present

## 2023-01-09 DIAGNOSIS — H1789 Other corneal scars and opacities: Secondary | ICD-10-CM | POA: Diagnosis not present

## 2023-01-23 ENCOUNTER — Encounter: Payer: Federal, State, Local not specified - PPO | Admitting: Medical

## 2023-02-18 ENCOUNTER — Ambulatory Visit: Payer: Federal, State, Local not specified - PPO | Admitting: Medical

## 2023-02-18 VITALS — BP 128/80 | HR 80 | Ht 75.0 in | Wt 186.0 lb

## 2023-02-18 DIAGNOSIS — Z131 Encounter for screening for diabetes mellitus: Secondary | ICD-10-CM | POA: Diagnosis not present

## 2023-02-18 DIAGNOSIS — R7989 Other specified abnormal findings of blood chemistry: Secondary | ICD-10-CM | POA: Diagnosis not present

## 2023-02-18 DIAGNOSIS — Z113 Encounter for screening for infections with a predominantly sexual mode of transmission: Secondary | ICD-10-CM | POA: Diagnosis not present

## 2023-02-18 DIAGNOSIS — H18603 Keratoconus, unspecified, bilateral: Secondary | ICD-10-CM

## 2023-02-18 DIAGNOSIS — R7301 Impaired fasting glucose: Secondary | ICD-10-CM | POA: Diagnosis not present

## 2023-02-18 DIAGNOSIS — Z0000 Encounter for general adult medical examination without abnormal findings: Secondary | ICD-10-CM

## 2023-02-18 LAB — POCT URINALYSIS DIP (PROADVANTAGE DEVICE)
Bilirubin, UA: NEGATIVE
Blood, UA: NEGATIVE
Glucose, UA: NEGATIVE mg/dL
Ketones, POC UA: NEGATIVE mg/dL
Leukocytes, UA: NEGATIVE
Nitrite, UA: NEGATIVE
Protein Ur, POC: NEGATIVE mg/dL
Specific Gravity, Urine: 1.015
Urobilinogen, Ur: NEGATIVE
pH, UA: 7 (ref 5.0–8.0)

## 2023-02-18 NOTE — Progress Notes (Signed)
Subjective:   HPI  Elijah Rojas is a 42 y.o. male who presents for Chief Complaint  Patient presents with   Annual Exam    Physical and fasting labs since last night. Pt states no other concerns. Possible A1c check     Patient Care Team: Katherene Dinino, Leward Quan as PCP - General (Family Medicine) Sees dentist Dr. Marilynne Halsted, ophthalmology specialist for keratoconus Dr. Rhae Lerner, ophthalmology Dr. 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  Seeing specialist for keratoconus  He is doing fine otherwise  He still is on Keppra but has talked to neurology about considering weaning off of this but this would require hospitalization visit and EEG monitoring.  He is not quite ready to do that as he is still recovering from the whole process with the keratoconus treatment  Reviewed their medical, surgical, family, social, medication, and allergy history and updated chart as appropriate.  Past Medical History:  Diagnosis Date   Allergy    Asthma    childhood, last flare worse age 6yo and younger   Migraine    diagnosed in 34s, none in a while, worse in 72s.  as of 07/2019    Past Surgical History:  Procedure Laterality Date   WISDOM TOOTH EXTRACTION      Family History  Problem Relation Age of Onset   Arthritis Mother    Sjogren's syndrome Mother        seropositive   Osteoporosis Mother    Hypertension Father    Cancer Father        tonsillar cancer, possible HPV related   Heart disease Maternal Grandmother    COPD Maternal Grandmother    Hypertension Maternal Grandfather    Cancer Maternal Grandfather        colon   Hypertension Paternal Grandmother    Kidney disease Paternal Grandmother        dialysis   Cancer Paternal Grandmother        breast   Cancer Maternal Aunt        colon   Diabetes Paternal Grandfather      Current Outpatient Medications:    levETIRAcetam (KEPPRA) 750 MG tablet, Take 750 mg  by mouth 2 (two) times daily., Disp: , Rfl:    Loratadine (CLARITIN PO), Take by mouth., Disp: , Rfl:    PREDNISOLONE ACETATE OP, , Disp: , Rfl:    pyridoxine (B-6) 100 MG tablet, Take by mouth., Disp: , Rfl:    ibuprofen (ADVIL) 400 MG tablet, Take 400 mg by mouth every 6 (six) hours as needed. (Patient not taking: Reported on 02/18/2023), Disp: , Rfl:   Allergies  Allergen Reactions   Amoxicillin Other (See Comments)    Upset stomach     Review of Systems  Constitutional:  Negative for chills, fever, malaise/fatigue and weight loss.  HENT:  Negative for congestion, ear pain, hearing loss, sore throat and tinnitus.   Eyes:  Negative for blurred vision, pain and redness.       Recent cornea transplant left eye 12/2022  Respiratory:  Negative for cough, hemoptysis and shortness of breath.   Cardiovascular:  Negative for chest pain, palpitations, orthopnea, claudication and leg swelling.  Gastrointestinal:  Negative for abdominal pain, blood in stool, constipation, diarrhea, nausea and vomiting.  Genitourinary:  Negative for dysuria, flank pain, frequency, hematuria and urgency.  Musculoskeletal:  Negative for falls, joint pain and myalgias.  Skin:  Negative for itching and rash.  Neurological:  Negative for dizziness, tingling, speech change, weakness and headaches.  Endo/Heme/Allergies:  Negative for polydipsia. Does not bruise/bleed easily.  Psychiatric/Behavioral:  Negative for depression and memory loss. The patient is not nervous/anxious and does not have insomnia.         02/18/2023    9:25 AM 01/22/2022    8:37 AM 10/23/2021    4:31 PM 01/19/2021    1:38 PM 08/04/2019   10:02 AM  Depression screen PHQ 2/9  Decreased Interest 0 0 0 0 0  Down, Depressed, Hopeless 0 0 0 0 0  PHQ - 2 Score 0 0 0 0 0        Objective:  BP 128/80   Pulse 80   Ht 6' 3"$  (1.905 m)   Wt 186 lb (84.4 kg)   BMI 23.25 kg/m   General appearance: alert, no distress, WD/WN Skin:  unremarkable HEENT: normocephalic, conjunctiva normal, sclerae anicteric, PERRLA, EOMi Neck: supple, no lymphadenopathy, no thyromegaly, no masses, normal ROM, no bruits Chest: non tender, normal shape and expansion Heart: RRR, normal S1, S2, no murmurs Lungs: CTA bilaterally, no wheezes, rhonchi, or rales Abdomen: +bs, soft, non tender, non distended, no masses, no hepatomegaly, no splenomegaly, no bruits Back: non tender, normal ROM, no scoliosis, no obvious deformity but he notes pain in low mid back  Musculoskeletal: upper extremities non tender, no obvious deformity, normal ROM throughout, lower extremities non tender, no obvious deformity, normal ROM throughout Extremities: no edema, no cyanosis, no clubbing Pulses: 2+ symmetric, upper and lower extremities, normal cap refill Neurological: alert, oriented x 3, CN2-12 intact, strength normal upper extremities and lower extremities, sensation normal throughout, DTRs 2+ throughout, no cerebellar signs, gait normal Psychiatric: normal affect, behavior normal, pleasant  GU: normal male external genitalia,uncircumcised, nontender, no masses, no hernia, no lymphadenopathy Rectal: deferred   Assessment and Plan :   Encounter Diagnoses  Name Primary?   Routine general medical examination at a health care facility Yes   Encounter for health maintenance examination in adult    Abnormal thyroid blood test    Screen for STD (sexually transmitted disease)    Impaired fasting blood sugar    Screening for diabetes mellitus    Keratoconus of both eyes     This visit was a preventative care visit, also known as wellness visit or routine physical.   Topics typically include healthy lifestyle, diet, exercise, preventative care, vaccinations, sick and well care, proper use of emergency dept and after hours care, as well as other concerns.     Recommendations: Continue to return yearly for your annual wellness and preventative care visits.  This  gives [place] a chance to discuss healthy lifestyle, exercise, vaccinations, review your chart record, and perform screenings where appropriate.  I recommend you see your eye doctor yearly for routine vision care.  I recommend you see your dentist yearly for routine dental care including hygiene visits twice yearly.   Vaccination recommendations were reviewed Immunization History  Administered Date(s) Administered   HPV 9-valent 07/24/2022, 09/27/2022   Influenza Split 11/01/2022   Influenza-Unspecified 10/26/2020, 11/26/2021, 11/01/2022   Moderna Sars-Covid-2 Vaccination 12/29/2019, 01/26/2020, 11/02/2020   Td 01/19/2021   Tdap 06/19/2010   Advised yearly flu shot   Screening for cancer: Colon cancer screening: Age 45  Testicular cancer screening You should do a monthly self testicular exam if you are between 33-5 years old  We discussed PSA, prostate exam, and prostate cancer screening risks/benefits.   Age 87  Skin  cancer screening: Check your skin regularly for new changes, growing lesions, or other lesions of concern Come in for evaluation if you have skin lesions of concern.  Lung cancer screening: If you have a greater than 20 pack year history of tobacco use, then you may qualify for lung cancer screening with a chest CT scan.   Please call your insurance company to inquire about coverage for this test.  We currently don't have screenings for other cancers besides breast, cervical, colon, and lung cancers.  If you have a strong family history of cancer or have other cancer screening concerns, please let me know.    Bone health: Get at least 150 minutes of aerobic exercise weekly Get weight bearing exercise at least once weekly Bone density test:  A bone density test is an imaging test that uses a type of X-ray to measure the amount of calcium and other minerals in your bones. The test may be used to diagnose or screen you for a condition that causes weak or thin bones  (osteoporosis), predict your risk for a broken bone (fracture), or determine how well your osteoporosis treatment is working. The bone density test is recommended for females 69 and older, or females or males [id number] if certain risk factors such as thyroid disease, long term use of steroids such as for asthma or rheumatological issues, vitamin D deficiency, estrogen deficiency, family history of osteoporosis, self or family history of fragility fracture in first degree relative.    Heart health: Get at least 150 minutes of aerobic exercise weekly Limit alcohol It is important to maintain a healthy blood pressure and healthy cholesterol numbers  Heart disease screening: Screening for heart disease includes screening for blood pressure, fasting lipids, glucose/diabetes screening, BMI height to weight ratio, reviewed of smoking status, physical activity, and diet.    Goals include blood pressure 120/80 or less, maintaining a healthy lipid/cholesterol profile, preventing diabetes or keeping diabetes numbers under good control, not smoking or using tobacco products, exercising most days per week or at least 150 minutes per week of exercise, and eating healthy variety of fruits and vegetables, healthy oils, and avoiding unhealthy food choices like fried food, fast food, high sugar and high cholesterol foods.    Other tests may possibly include EKG test, CT coronary calcium score, echocardiogram, exercise treadmill stress test.     Medical care options: I recommend you continue to seek care here first for routine care.  We try really hard to have available appointments Monday through Friday daytime hours for sick visits, acute visits, and physicals.  Urgent care should be used for after hours and weekends for significant issues that cannot wait till the next day.  The emergency department should be used for significant potentially life-threatening emergencies.  The emergency department is expensive, can  often have long wait times for less significant concerns, so try to utilize primary care, urgent care, or telemedicine when possible to avoid unnecessary trips to the emergency department.  Virtual visits and telemedicine have been introduced since the pandemic started in 2020, and can be convenient ways to receive medical care.  We offer virtual appointments as well to assist you in a variety of options to seek medical care.    Separate significant issues discussed: History of seizure in 2022, been on Keppra since but neurology may help him do a EEG under hospitalization to help wean off Keppra.  This may be later this year  Keratoconus -recent corneal transplant January 2024 on left  eye.  He has follow-up planned with ophthalmology  Tavarris was seen today for annual exam.  Diagnoses and all orders for this visit:  Routine general medical examination at a health care facility -     POCT Urinalysis DIP (Proadvantage Device) -     Comprehensive metabolic panel -     CBC -     TSH -     VITAMIN D 25 Hydroxy (Vit-D Deficiency, Fractures) -     Hemoglobin A1c -     Hepatitis C antibody -     Hepatitis B surface antigen -     RPR+HIV+GC+CT Panel  Encounter for health maintenance examination in adult  Abnormal thyroid blood test  Screen for STD (sexually transmitted disease) -     Hepatitis C antibody -     Hepatitis B surface antigen -     RPR+HIV+GC+CT Panel  Impaired fasting blood sugar -     Hemoglobin A1c  Screening for diabetes mellitus -     Hemoglobin A1c  Keratoconus of both eyes -     VITAMIN D 25 Hydroxy (Vit-D Deficiency, Fractures)   Follow-up pending labs, yearly for physical

## 2023-02-20 ENCOUNTER — Other Ambulatory Visit: Payer: Self-pay | Admitting: Medical

## 2023-02-20 DIAGNOSIS — R7989 Other specified abnormal findings of blood chemistry: Secondary | ICD-10-CM

## 2023-02-20 LAB — CBC
Hematocrit: 45.5 % (ref 37.5–51.0)
Hemoglobin: 15.3 g/dL (ref 13.0–17.7)
MCH: 29.6 pg (ref 26.6–33.0)
MCHC: 33.6 g/dL (ref 31.5–35.7)
MCV: 88 fL (ref 79–97)
Platelets: 178 10*3/uL (ref 150–450)
RBC: 5.17 x10E6/uL (ref 4.14–5.80)
RDW: 12.7 % (ref 11.6–15.4)
WBC: 6.9 10*3/uL (ref 3.4–10.8)

## 2023-02-20 LAB — COMPREHENSIVE METABOLIC PANEL
ALT: 58 IU/L — ABNORMAL HIGH (ref 0–44)
AST: 55 IU/L — ABNORMAL HIGH (ref 0–40)
Albumin/Globulin Ratio: 2 (ref 1.2–2.2)
Albumin: 4.7 g/dL (ref 4.1–5.1)
Alkaline Phosphatase: 57 IU/L (ref 44–121)
BUN/Creatinine Ratio: 26 — ABNORMAL HIGH (ref 9–20)
BUN: 27 mg/dL — ABNORMAL HIGH (ref 6–24)
Bilirubin Total: 0.4 mg/dL (ref 0.0–1.2)
CO2: 22 mmol/L (ref 20–29)
Calcium: 9.3 mg/dL (ref 8.7–10.2)
Chloride: 108 mmol/L — ABNORMAL HIGH (ref 96–106)
Creatinine, Ser: 1.04 mg/dL (ref 0.76–1.27)
Globulin, Total: 2.3 g/dL (ref 1.5–4.5)
Glucose: 97 mg/dL (ref 70–99)
Potassium: 4.9 mmol/L (ref 3.5–5.2)
Sodium: 142 mmol/L (ref 134–144)
Total Protein: 7 g/dL (ref 6.0–8.5)
eGFR: 93 mL/min/{1.73_m2} (ref >59)

## 2023-02-20 LAB — HEPATITIS B SURFACE ANTIGEN: Hepatitis B Surface Ag: NEGATIVE

## 2023-02-20 LAB — HEMOGLOBIN A1C
Est. average glucose Bld gHb Est-mCnc: 105 mg/dL
Hgb A1c MFr Bld: 5.3 % (ref 4.8–5.6)

## 2023-02-20 LAB — VITAMIN D 25 HYDROXY (VIT D DEFICIENCY, FRACTURES): Vit D, 25-Hydroxy: 36.9 ng/mL (ref 30.0–100.0)

## 2023-02-20 LAB — HEPATITIS C ANTIBODY: Hep C Virus Ab: NONREACTIVE

## 2023-02-20 LAB — RPR+HIV+GC+CT PANEL
Chlamydia trachomatis, NAA: NEGATIVE
HIV Screen 4th Generation wRfx: NONREACTIVE
Neisseria Gonorrhoeae by PCR: NEGATIVE
RPR Ser Ql: NONREACTIVE

## 2023-02-20 LAB — TSH: TSH: 0.529 u[IU]/mL (ref 0.450–4.500)

## 2023-02-20 NOTE — Progress Notes (Signed)
Results sent through MyChart

## 2023-02-24 DIAGNOSIS — R569 Unspecified convulsions: Secondary | ICD-10-CM | POA: Diagnosis not present

## 2023-03-14 DIAGNOSIS — Z947 Corneal transplant status: Secondary | ICD-10-CM | POA: Diagnosis not present

## 2023-03-14 DIAGNOSIS — H18601 Keratoconus, unspecified, right eye: Secondary | ICD-10-CM | POA: Diagnosis not present

## 2023-03-31 ENCOUNTER — Other Ambulatory Visit (INDEPENDENT_AMBULATORY_CARE_PROVIDER_SITE_OTHER): Payer: Federal, State, Local not specified - PPO

## 2023-03-31 DIAGNOSIS — Z23 Encounter for immunization: Secondary | ICD-10-CM | POA: Diagnosis not present

## 2023-03-31 DIAGNOSIS — R7989 Other specified abnormal findings of blood chemistry: Secondary | ICD-10-CM | POA: Diagnosis not present

## 2023-04-01 LAB — HEPATIC FUNCTION PANEL
ALT: 44 IU/L (ref 0–44)
AST: 42 IU/L — ABNORMAL HIGH (ref 0–40)
Albumin: 4.6 g/dL (ref 4.1–5.1)
Alkaline Phosphatase: 53 IU/L (ref 44–121)
Bilirubin Total: 0.4 mg/dL (ref 0.0–1.2)
Bilirubin, Direct: 0.11 mg/dL (ref 0.00–0.40)
Total Protein: 6.9 g/dL (ref 6.0–8.5)

## 2023-04-01 LAB — IRON: Iron: 79 ug/dL (ref 38–169)

## 2023-04-01 NOTE — Progress Notes (Signed)
Results sent through MyChart

## 2023-05-05 DIAGNOSIS — R569 Unspecified convulsions: Secondary | ICD-10-CM | POA: Diagnosis not present

## 2023-06-03 DIAGNOSIS — H18611 Keratoconus, stable, right eye: Secondary | ICD-10-CM | POA: Diagnosis not present

## 2023-06-03 DIAGNOSIS — Z947 Corneal transplant status: Secondary | ICD-10-CM | POA: Diagnosis not present

## 2023-07-15 DIAGNOSIS — H18601 Keratoconus, unspecified, right eye: Secondary | ICD-10-CM | POA: Diagnosis not present

## 2023-07-15 DIAGNOSIS — Z947 Corneal transplant status: Secondary | ICD-10-CM | POA: Diagnosis not present

## 2023-08-21 DIAGNOSIS — Z23 Encounter for immunization: Secondary | ICD-10-CM | POA: Diagnosis not present

## 2023-08-26 ENCOUNTER — Ambulatory Visit: Payer: Federal, State, Local not specified - PPO | Admitting: Medical

## 2023-08-26 VITALS — BP 124/80 | HR 88 | Wt 182.8 lb

## 2023-08-26 DIAGNOSIS — Z7251 High risk heterosexual behavior: Secondary | ICD-10-CM | POA: Diagnosis not present

## 2023-08-26 DIAGNOSIS — Z299 Encounter for prophylactic measures, unspecified: Secondary | ICD-10-CM | POA: Diagnosis not present

## 2023-08-26 DIAGNOSIS — Z113 Encounter for screening for infections with a predominantly sexual mode of transmission: Secondary | ICD-10-CM | POA: Diagnosis not present

## 2023-08-26 MED ORDER — DESCOVY 200-25 MG PO TABS: 1.0000 | ORAL_TABLET | Freq: Every day | ORAL | 0 refills | Status: DC

## 2023-08-26 NOTE — Progress Notes (Signed)
Subjective:  Elijah Rojas is a 42 y.o. male who presents for Chief Complaint  Patient presents with   Consult    Wants to talk about starting PREP.      Here for concerns for PreP.  Not having lots of partners, but curious about pros/cons.  Wants the protection.  Uses condoms.     Works in health care, and has prior titers for hep B showing immunity.    Got Monkey pox vaccine last week.    No other aggravating or relieving factors.    No other c/o.  Past Medical History:  Diagnosis Date   Allergy    Asthma    childhood, last flare worse age 65yo and younger   Migraine    diagnosed in 94s, none in a while, worse in 32s.  as of 07/2019   Current Outpatient Medications on File Prior to Visit  Medication Sig Dispense Refill   levETIRAcetam (KEPPRA) 750 MG tablet Take 750 mg by mouth 2 (two) times daily.     Loratadine (CLARITIN PO) Take by mouth.     PREDNISOLONE ACETATE OP      ibuprofen (ADVIL) 400 MG tablet Take 400 mg by mouth every 6 (six) hours as needed. (Patient not taking: Reported on 02/18/2023)     No current facility-administered medications on file prior to visit.     The following portions of the patient's history were reviewed and updated as appropriate: allergies, current medications, past family history, past medical history, past social history, past surgical history and problem list.  ROS Otherwise as in subjective above    Objective: BP 124/80   Pulse 88   Wt 182 lb 12.8 oz (82.9 kg)   BMI 22.85 kg/m   General appearance: alert, no distress, well developed, well nourished      Assessment: Encounter Diagnoses  Name Primary?   Prophylactic measure Yes   Screen for STD (sexually transmitted disease)    High risk sexual behavior, unspecified type      Plan: Discussed PreP, risks/benefits, proper use, condom use recommended, routine labs and follow up advised.    Baseline labs today to initiate PreP    Elijah Rojas was seen today for  consult.  Diagnoses and all orders for this visit:  Prophylactic measure -     RPR+HIV+GC+CT Panel -     Hepatitis B surface antigen -     Hepatitis B core antibody, IgM -     Hepatitis C antibody  Screen for STD (sexually transmitted disease) -     RPR+HIV+GC+CT Panel -     Hepatitis B surface antigen -     Hepatitis B core antibody, IgM -     Hepatitis C antibody  High risk sexual behavior, unspecified type -     RPR+HIV+GC+CT Panel -     Hepatitis B surface antigen -     Hepatitis B core antibody, IgM -     Hepatitis C antibody  Other orders -     emtricitabine-tenofovir AF (DESCOVY) 200-25 MG tablet; Take 1 tablet by mouth daily.    Follow up: pending labs

## 2023-08-27 NOTE — Progress Notes (Signed)
Results sent through MyChart

## 2023-08-29 LAB — HEPATITIS B SURFACE ANTIGEN: Hepatitis B Surface Ag: NEGATIVE

## 2023-08-29 LAB — HEPATITIS C ANTIBODY: Hep C Virus Ab: NONREACTIVE

## 2023-08-29 LAB — RPR+HIV+GC+CT PANEL
Chlamydia trachomatis, NAA: NEGATIVE
HIV Screen 4th Generation wRfx: NONREACTIVE
Neisseria Gonorrhoeae by PCR: NEGATIVE
RPR Ser Ql: NONREACTIVE

## 2023-08-29 LAB — HEPATITIS B CORE ANTIBODY, IGM: Hep B C IgM: NEGATIVE

## 2023-08-29 NOTE — Progress Notes (Signed)
Results sent through MyChart

## 2023-09-02 ENCOUNTER — Other Ambulatory Visit: Payer: Self-pay | Admitting: Medical

## 2023-09-02 MED ORDER — EMTRICITABINE-TENOFOVIR DF 200-300 MG PO TABS: 1.0000 | ORAL_TABLET | Freq: Every day | ORAL | 0 refills | Status: DC

## 2023-09-09 DIAGNOSIS — Z947 Corneal transplant status: Secondary | ICD-10-CM | POA: Diagnosis not present

## 2023-09-09 DIAGNOSIS — H18601 Keratoconus, unspecified, right eye: Secondary | ICD-10-CM | POA: Diagnosis not present

## 2023-09-18 DIAGNOSIS — Z23 Encounter for immunization: Secondary | ICD-10-CM | POA: Diagnosis not present

## 2023-10-02 DIAGNOSIS — R569 Unspecified convulsions: Secondary | ICD-10-CM | POA: Diagnosis not present

## 2023-10-14 DIAGNOSIS — Z947 Corneal transplant status: Secondary | ICD-10-CM | POA: Diagnosis not present

## 2023-10-14 DIAGNOSIS — H18601 Keratoconus, unspecified, right eye: Secondary | ICD-10-CM | POA: Diagnosis not present

## 2023-11-11 DIAGNOSIS — Z947 Corneal transplant status: Secondary | ICD-10-CM | POA: Diagnosis not present

## 2023-11-11 DIAGNOSIS — H18601 Keratoconus, unspecified, right eye: Secondary | ICD-10-CM | POA: Diagnosis not present

## 2023-11-19 ENCOUNTER — Ambulatory Visit
Admission: RE | Admit: 2023-11-19 | Discharge: 2023-11-19 | Disposition: A | Discharge status: Home / Self Care | Payer: Federal, State, Local not specified - PPO | Source: Ambulatory Visit | Attending: Family Medicine | Admitting: Family Medicine

## 2023-11-19 VITALS — BP 146/82 | HR 88 | Temp 98.3°F | Resp 17

## 2023-11-19 DIAGNOSIS — Z113 Encounter for screening for infections with a predominantly sexual mode of transmission: Secondary | ICD-10-CM | POA: Diagnosis not present

## 2023-11-19 DIAGNOSIS — R369 Urethral discharge, unspecified: Secondary | ICD-10-CM | POA: Diagnosis not present

## 2023-11-19 DIAGNOSIS — Z202 Contact with and (suspected) exposure to infections with a predominantly sexual mode of transmission: Secondary | ICD-10-CM | POA: Insufficient documentation

## 2023-11-19 DIAGNOSIS — Z114 Encounter for screening for human immunodeficiency virus [HIV]: Secondary | ICD-10-CM | POA: Insufficient documentation

## 2023-11-19 MED ORDER — DOXYCYCLINE HYCLATE 100 MG PO CAPS: 100.0000 mg | ORAL_CAPSULE | Freq: Two times a day (BID) | ORAL | 0 refills | Status: DC

## 2023-11-19 NOTE — ED Provider Notes (Signed)
[facility]    CSN: [id number] Arrival date & time: 11/19/23  0845      History   Chief Complaint Chief Complaint  Patient presents with   SEXUALLY TRANSMITTED DISEASE    Entered by patient    HPI Elijah Rojas is a 42 y.o. male.   HPI  Past Medical History:  Diagnosis Date   Allergy    Asthma    childhood, last flare worse age 83yo and younger   Migraine    diagnosed in 50s, none in a while, worse in 39s.  as of 07/2019    Patient Active Problem List   Diagnosis Date Noted   Low back pain without sciatica 01/22/2022   Abnormal thyroid blood test 01/22/2022   Eye abnormality 10/23/2021   History of meningitis 10/23/2021   Irritability 10/23/2021   Concentration deficit 10/23/2021   Encounter for health maintenance examination in adult 01/19/2021   Screening for lipid disorders 01/19/2021   Chronic pelvic pain in male 01/19/2021   Abnormal thyroid exam 01/19/2021   Screen for STD (sexually transmitted disease) 08/04/2019    Past Surgical History:  Procedure Laterality Date   WISDOM TOOTH EXTRACTION         Home Medications    Prior to Admission medications   Medication Sig Start Date End Date Taking? Authorizing Provider  doxycycline (VIBRAMYCIN) 100 MG capsule Take 1 capsule (100 mg total) by mouth 2 (two) times daily for 7 days. 11/19/23 11/26/23 Yes Trevor Iha, FNP  emtricitabine-tenofovir (TRUVADA) 200-300 MG tablet Take 1 tablet by mouth daily. 09/02/23   Tysinger, Kermit Balo, PA-C  ibuprofen (ADVIL) 400 MG tablet Take 400 mg by mouth every 6 (six) hours as needed. Patient not taking: Reported on 02/18/2023    [provider]  levETIRAcetam (KEPPRA) 750 MG tablet Take 750 mg by mouth 2 (two) times daily. 12/06/21   [provider]  Loratadine (CLARITIN PO) Take by mouth.    [provider]  PREDNISOLONE ACETATE OP  01/09/23   [provider]    Family History Family History  Problem Relation Age of  Onset   Arthritis Mother    Sjogren's syndrome Mother        seropositive   Osteoporosis Mother    Hypertension Father    Cancer Father        tonsillar cancer, possible HPV related   Heart disease Maternal Grandmother    COPD Maternal Grandmother    Hypertension Maternal Grandfather    Cancer Maternal Grandfather        colon   Hypertension Paternal Grandmother    Kidney disease Paternal Grandmother        dialysis   Cancer Paternal Grandmother        breast   Cancer Maternal Aunt        colon   Diabetes Paternal Grandfather     Social History Social History   Tobacco Use   Smoking status: Never   Smokeless tobacco: Never  Vaping Use   Vaping status: Never Used  Substance Use Topics   Alcohol use: Yes    Alcohol/week: 6.0 standard drinks of alcohol    Types: 6 Glasses of wine per week    Comment: 5-6 weekly   Drug use: Never     Allergies   Amoxicillin   Review of Systems Review of Systems  Genitourinary:  Positive for penile discharge.  All other systems reviewed and are negative.    Physical Exam Triage Vital  Signs ED Triage Vitals  Encounter Vitals Group     BP 11/19/23 0903 (!) 146/82     Systolic BP Percentile --      Diastolic BP Percentile --      Pulse Rate 11/19/23 0903 88     Resp 11/19/23 0903 17     Temp 11/19/23 0903 98.3 F (36.8 C)     Temp Source 11/19/23 0903 Oral     SpO2 11/19/23 0903 100 %     Weight --      Height --      Head Circumference --      Peak Flow --      Pain Score 11/19/23 0905 0     Pain Loc --      Pain Education --      Exclude from Growth Chart --    No data found.  Updated Vital Signs BP (!) 146/82 (BP Location: Left Arm)   Pulse 88   Temp 98.3 F (36.8 C) (Oral)   Resp 17   SpO2 100%    Physical Exam Vitals and nursing note reviewed.  Constitutional:      Appearance: Normal appearance. He is obese.  HENT:     Head: Normocephalic and atraumatic.     Mouth/Throat:     Mouth: Mucous  membranes are moist.     Pharynx: Oropharynx is clear.  Eyes:     Pupils: Pupils are equal, round, and reactive to light.  Cardiovascular:     Rate and Rhythm: Normal rate and regular rhythm.     Pulses: Normal pulses.     Heart sounds: Normal heart sounds.  Pulmonary:     Effort: Pulmonary effort is normal.     Breath sounds: Normal breath sounds. No wheezing or rales.  Musculoskeletal:        General: Normal range of motion.     Cervical back: Normal range of motion and neck supple.  Skin:    General: Skin is warm and dry.  Neurological:     General: No focal deficit present.     Mental Status: He is alert and oriented to person, place, and time. Mental status is at baseline.  Psychiatric:        Mood and Affect: Mood normal.        Behavior: Behavior normal.      UC Treatments / Results  Labs (all labs ordered are listed, but only abnormal results are displayed) Labs Reviewed  HIV ANTIBODY (ROUTINE TESTING W REFLEX)  RPR  HEPATITIS C ANTIBODY  CYTOLOGY, (ORAL, ANAL, URETHRAL) ANCILLARY ONLY    EKG   Radiology No results found.  Procedures Procedures (including critical care time)  Medications Ordered in UC Medications - No data to display  Initial Impression / Assessment and Plan / UC Course  I have reviewed the triage vital signs and the nursing notes.  Pertinent labs & imaging results that were available during my care of the patient were reviewed by me and considered in my medical decision making (see chart for details).     MDM: 1.  Exposure to sexually transmitted disease-Aptima swab ordered (trichomonas, GC chlamydia), HIV antibody (routine testing with reflex), RPR, hep C antibody ordered; 2.  Penile discharge-Aptima swab ordered, Rx'd doxycycline 100 mg capsule: Take 1 capsule twice daily x 7 days. Instructed patient to take medication as directed with food to completion.  Encouraged to increase daily water intake to 64 ounces per day while taking  this medication.  Patient discharged home, hemodynamically stable.  Advised we will follow-up with lab results once received.  Patient discharged home, hemodynamically stable. Final Clinical Impressions(s) / UC Diagnoses   Final diagnoses:  Exposure to sexually transmitted disease (STD)  Penile discharge     Discharge Instructions      Instructed patient to take medication as directed with food to completion.  Encouraged to increase daily water intake to 64 ounces per day while taking this medication.  Patient discharged home, hemodynamically stable.  Advised we will follow-up with lab results once received.     ED Prescriptions     Medication Sig Dispense Auth. Provider   doxycycline (VIBRAMYCIN) 100 MG capsule Take 1 capsule (100 mg total) by mouth 2 (two) times daily for 7 days. 14 capsule Trevor Iha, FNP      PDMP not reviewed this encounter.   Trevor Iha, FNP 11/19/23 1039

## 2023-11-19 NOTE — Discharge Instructions (Addendum)
Instructed patient to take medication as directed with food to completion.  Encouraged to increase daily water intake to 64 ounces per day while taking this medication.  Patient discharged home, hemodynamically stable.  Advised we will follow-up with lab results once received.

## 2023-11-19 NOTE — ED Triage Notes (Signed)
Pt here today for STD testing. States he noticed some penile discharge. Denies dysuria. Would also like STI bloodwork done as well as he's due for a refill on his "prep" soon.

## 2023-11-20 LAB — CYTOLOGY, (ORAL, ANAL, URETHRAL) ANCILLARY ONLY
Chlamydia: NEGATIVE
Comment: NEGATIVE
Comment: NEGATIVE
Comment: NORMAL
Neisseria Gonorrhea: NEGATIVE
Trichomonas: NEGATIVE

## 2023-11-20 LAB — RPR: RPR Ser Ql: NONREACTIVE

## 2023-11-20 LAB — HIV ANTIBODY (ROUTINE TESTING W REFLEX): HIV Screen 4th Generation wRfx: NONREACTIVE

## 2023-11-20 LAB — HEPATITIS C ANTIBODY: Hep C Virus Ab: NONREACTIVE

## 2023-11-26 ENCOUNTER — Ambulatory Visit: Payer: Federal, State, Local not specified - PPO | Admitting: Medical

## 2023-11-26 VITALS — BP 112/70 | HR 68 | Temp 97.9°F | Wt 180.8 lb

## 2023-11-26 DIAGNOSIS — G8929 Other chronic pain: Secondary | ICD-10-CM

## 2023-11-26 DIAGNOSIS — Z299 Encounter for prophylactic measures, unspecified: Secondary | ICD-10-CM | POA: Diagnosis not present

## 2023-11-26 DIAGNOSIS — Z7251 High risk heterosexual behavior: Secondary | ICD-10-CM | POA: Diagnosis not present

## 2023-11-26 DIAGNOSIS — R102 Pelvic and perineal pain: Secondary | ICD-10-CM | POA: Diagnosis not present

## 2023-11-26 DIAGNOSIS — R300 Dysuria: Secondary | ICD-10-CM | POA: Diagnosis not present

## 2023-11-26 LAB — POCT URINALYSIS DIP (PROADVANTAGE DEVICE)
Bilirubin, UA: NEGATIVE
Blood, UA: NEGATIVE
Glucose, UA: NEGATIVE mg/dL
Ketones, POC UA: NEGATIVE mg/dL
Leukocytes, UA: NEGATIVE
Nitrite, UA: NEGATIVE
Protein Ur, POC: NEGATIVE mg/dL
Specific Gravity, Urine: 1.01
Urobilinogen, Ur: NEGATIVE
pH, UA: 6 (ref 5.0–8.0)

## 2023-11-26 MED ORDER — EMTRICITABINE-TENOFOVIR DF 200-300 MG PO TABS: 1.0000 | ORAL_TABLET | Freq: Every day | ORAL | 0 refills | Status: DC

## 2023-11-26 MED ORDER — DOXYCYCLINE HYCLATE 100 MG PO CAPS: 100.0000 mg | ORAL_CAPSULE | Freq: Two times a day (BID) | ORAL | 0 refills | Status: DC

## 2023-11-26 NOTE — Progress Notes (Signed)
Subjective:  Elijah Rojas is a 42 y.o. male who presents for Chief Complaint  Patient presents with   possible UTI    Possible UTI or Prostatitis. Went to UC last week for issues and was given doxcycline and seem to clean up a lot but just wants to make sure.      Here for urinary f/u.  Last week went to urgent care for penile discharge, urinary discomfort, groin discomfort.   About 80% better now on Doxycycline.  Has just to another day left on doxycycline.  He is compliant with Truvada PrEP for high risk sexual behavior.  He has a few ongoing partners but not new partners all the time.  He had recent STD screening that was negative at the urgent care.  Has had this a few times in the past including in 2022.  Has seen urologist prior for this, had several tests.  The going idea was prostatitis in the past.  No current blood in the urine or semen, no urinary frequency.   Does have some pelvic discomfort.  No fever, no body aches or chills.   No other aggravating or relieving factors.    No other c/o.  Past Medical History:  Diagnosis Date   Allergy    Asthma    childhood, last flare worse age 84yo and younger   Migraine    diagnosed in 55s, none in a while, worse in 49s.  as of 07/2019   Current Outpatient Medications on File Prior to Visit  Medication Sig Dispense Refill   ibuprofen (ADVIL) 400 MG tablet Take 400 mg by mouth every 6 (six) hours as needed.     levETIRAcetam (KEPPRA) 750 MG tablet Take 750 mg by mouth 2 (two) times daily.     Loratadine (CLARITIN PO) Take by mouth.     PREDNISOLONE ACETATE OP      No current facility-administered medications on file prior to visit.    The following portions of the patient's history were reviewed and updated as appropriate: allergies, current medications, past family history, past medical history, past social history, past surgical history and problem list.  ROS Otherwise as in subjective above   Objective: BP 112/70    Pulse 68   Temp 97.9 F (36.6 C)   Wt 180 lb 12.8 oz (82 kg)   BMI 22.60 kg/m   General appearance: alert, no distress, well developed, well nourished     Assessment: Encounter Diagnoses  Name Primary?   Dysuria Yes   Chronic pelvic pain in male    High risk sexual behavior, unspecified type    Prophylactic measure      Plan: I reviewed his recent urgent care visit notes and labs.  Negative STD.  Urinalysis today unremarkable.  I suspect prostatitis.  Go ahead and do 1 more week of doxycycline since only about 80% improved  Hydrate well with water throughout the day.  Discussed diagnosis of prostatitis.  Advised if he gets ongoing recurrence of symptoms over the next few months on and off like he may have had in the past we may need to get him back in urology for further discussion on management of chronic prostatitis problems.  Continue routine follow-up for Truvada PrEP  Elijah Rojas was seen today for possible uti.  Diagnoses and all orders for this visit:  Dysuria -     POCT Urinalysis DIP (Proadvantage Device)  Chronic pelvic pain in male  High risk sexual behavior, unspecified type  Prophylactic measure  Other orders -     doxycycline (VIBRAMYCIN) 100 MG capsule; Take 1 capsule (100 mg total) by mouth 2 (two) times daily for 7 days. -     emtricitabine-tenofovir (TRUVADA) 200-300 MG tablet; Take 1 tablet by mouth daily.    Follow up: prn

## 2023-12-09 DIAGNOSIS — Z947 Corneal transplant status: Secondary | ICD-10-CM | POA: Diagnosis not present

## 2023-12-09 DIAGNOSIS — H18601 Keratoconus, unspecified, right eye: Secondary | ICD-10-CM | POA: Diagnosis not present

## 2023-12-22 ENCOUNTER — Other Ambulatory Visit: Payer: Self-pay | Admitting: Medical

## 2023-12-22 MED ORDER — DOXYCYCLINE HYCLATE 100 MG PO CAPS: 100.0000 mg | ORAL_CAPSULE | Freq: Two times a day (BID) | ORAL | 0 refills | Status: AC

## 2024-02-20 ENCOUNTER — Encounter: Payer: Self-pay | Admitting: Medical

## 2024-02-20 ENCOUNTER — Ambulatory Visit: Payer: Federal, State, Local not specified - PPO | Admitting: Medical

## 2024-02-20 VITALS — BP 120/76 | HR 60 | Ht 73.0 in | Wt 174.0 lb

## 2024-02-20 DIAGNOSIS — Z23 Encounter for immunization: Secondary | ICD-10-CM

## 2024-02-20 DIAGNOSIS — Z87438 Personal history of other diseases of male genital organs: Secondary | ICD-10-CM | POA: Diagnosis not present

## 2024-02-20 DIAGNOSIS — Z7185 Encounter for immunization safety counseling: Secondary | ICD-10-CM

## 2024-02-20 DIAGNOSIS — Z7251 High risk heterosexual behavior: Secondary | ICD-10-CM

## 2024-02-20 DIAGNOSIS — Z113 Encounter for screening for infections with a predominantly sexual mode of transmission: Secondary | ICD-10-CM | POA: Diagnosis not present

## 2024-02-20 DIAGNOSIS — Z0000 Encounter for general adult medical examination without abnormal findings: Secondary | ICD-10-CM | POA: Diagnosis not present

## 2024-02-20 DIAGNOSIS — B001 Herpesviral vesicular dermatitis: Secondary | ICD-10-CM

## 2024-02-20 MED ORDER — VALACYCLOVIR HCL 1 G PO TABS: 2000.0000 mg | ORAL_TABLET | Freq: Two times a day (BID) | ORAL | 0 refills | Status: DC

## 2024-02-20 NOTE — Addendum Note (Signed)
Addended by: Herminio Commons A on: 02/20/2024 03:49 PM   Modules accepted: Orders

## 2024-02-20 NOTE — Addendum Note (Signed)
Addended by: Herminio Commons A on: 02/20/2024 10:28 AM   Modules accepted: Orders

## 2024-02-20 NOTE — Progress Notes (Signed)
Subjective:   HPI  Elijah Rojas is a 43 y.o. male who presents for Chief Complaint  Patient presents with   Annual Exam    Fasting cpe, no concerns, had a cold over a week ago and got a cold sore on nose.     Patient Care Team: Denarius Sesler, Kermit Balo, PA-C as PCP - General (Family Medicine) Quinn Plowman, PA-C as Physician Assistant (General Practice) Holli Humbles, MD as Referring Physician (Ophthalmology) Sees dentist Dr. Holli Humbles, ophthalmology specialist for keratoconus Dr. Hubbard Hartshorn, ophthalmology Dr. Quinn Plowman, neurology   Concerns: Recently he has been dealing with a cold and had a subsequent cold sore pop up on his nose externally.  He is always experienced these in turn but this is the first time it was more external and it is quite bad.  Is crusting at this point.  Back in November he had urinary issues that was thought to be related to prostate infection.  Antibiotics finally cleared that up.  Otherwise normal state of health.  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  Seeing specialist for keratoconus  Compliant with Keppra, sees neurology  Reviewed their medical, surgical, family, social, medication, and allergy history and updated chart as appropriate.  Past Medical History:  Diagnosis Date   Allergy    Asthma    childhood, last flare worse age 50yo and younger   Migraine    diagnosed in 50s, none in a while, worse in 18s.  as of 07/2019   Seizure (HCC) 2023    Past Surgical History:  Procedure Laterality Date   WISDOM TOOTH EXTRACTION      Family History  Problem Relation Age of Onset   Arthritis Mother    Sjogren's syndrome Mother        seropositive   Osteoporosis Mother    Hypertension Father    Cancer Father        tonsillar cancer, possible HPV related   Heart disease Maternal Grandmother    COPD Maternal Grandmother    Hypertension Maternal Grandfather    Cancer Maternal Grandfather        colon   Hypertension  Paternal Grandmother    Kidney disease Paternal Grandmother        dialysis   Cancer Paternal Grandmother        breast   Cancer Maternal Aunt        colon   Diabetes Paternal Grandfather      Current Outpatient Medications:    emtricitabine-tenofovir (TRUVADA) 200-300 MG tablet, Take 1 tablet by mouth daily., Disp: 90 tablet, Rfl: 0   levETIRAcetam (KEPPRA) 750 MG tablet, Take 750 mg by mouth 2 (two) times daily., Disp: , Rfl:    PREDNISOLONE ACETATE OP, , Disp: , Rfl:    valACYclovir (VALTREX) 1000 MG tablet, Take 2 tablets (2,000 mg total) by mouth 2 (two) times daily for 2 days. For flare ups., Disp: 30 tablet, Rfl: 0   ibuprofen (ADVIL) 400 MG tablet, Take 400 mg by mouth every 6 (six) hours as needed. (Patient not taking: Reported on 02/20/2024), Disp: , Rfl:    Loratadine (CLARITIN PO), Take by mouth. (Patient not taking: Reported on 02/20/2024), Disp: , Rfl:   Allergies  Allergen Reactions   Amoxicillin Other (See Comments)    Upset stomach     Review of Systems  Constitutional:  Negative for chills, fever, malaise/fatigue and weight loss.  HENT:  Negative for congestion, ear pain, hearing loss, sore throat and tinnitus.  Cold sore of nose, recent cold symptoms, congestion, runny nose  Eyes:  Negative for blurred vision, pain and redness.       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   Cardiovascular:  Negative for chest pain, palpitations, orthopnea, claudication and leg swelling.  Gastrointestinal:  Negative for abdominal pain, blood in stool, constipation, diarrhea, nausea and vomiting.  Genitourinary:  Negative for dysuria, flank pain, frequency, hematuria and urgency.  Musculoskeletal:  Negative for falls, joint pain and myalgias.  Skin:  Negative for itching and rash.  Neurological:  Negative for dizziness, tingling, speech change, weakness and headaches.  Endo/Heme/Allergies:  Negative for  polydipsia. Does not bruise/bleed easily.  Psychiatric/Behavioral:  Negative for depression and memory loss. The patient is not nervous/anxious and does not have insomnia.         02/20/2024    8:58 AM 02/18/2023    9:25 AM 01/22/2022    8:37 AM 10/23/2021    4:31 PM 01/19/2021    1:38 PM  Depression screen PHQ 2/9  Decreased Interest 0 0 0 0 0  Down, Depressed, Hopeless 0 0 0 0 0  PHQ - 2 Score 0 0 0 0 0        Objective:  BP 120/76   Pulse 60   Ht 6\' 1"  (1.854 m)   Wt 174 lb (78.9 kg)   SpO2 100%   BMI 22.96 kg/m   General appearance: alert, no distress, WD/WN Skin: Erythematous lesions along external nares at opening of nasal passages bilat, otherwise unremarkable HEENT: normocephalic, conjunctiva normal, sclerae anicteric, PERRLA, EOMi Neck: supple, no lymphadenopathy, no thyromegaly, no masses, normal ROM, no bruits Chest: non tender, normal shape and expansion Heart: RRR, normal S1, S2, no murmurs Lungs: CTA bilaterally, no wheezes, rhonchi, or rales Abdomen: +bs, soft, non tender, non distended, no masses, no hepatomegaly, no splenomegaly, no bruits Back: non tender, normal ROM, no scoliosis, no obvious deformity but he notes pain in low mid back  Musculoskeletal: upper extremities non tender, no obvious deformity, normal ROM throughout, lower extremities non tender, no obvious deformity, normal ROM throughout Extremities: no edema, no cyanosis, no clubbing Pulses: 2+ symmetric, upper and lower extremities, normal cap refill Neurological: alert, oriented x 3, CN2-12 intact, strength normal upper extremities and lower extremities, sensation normal throughout, DTRs 2+ throughout, no cerebellar signs, gait normal Psychiatric: normal affect, behavior normal, pleasant  GU: normal male external genitalia,uncircumcised, nontender, no masses, no hernia, no lymphadenopathy Rectal: deferred    Assessment and Plan :   Encounter Diagnoses  Name Primary?   Encounter for  health maintenance examination in adult Yes   Vaccine counseling    Need for hepatitis A immunization    Screen for STD (sexually transmitted disease)    Cold sore    High risk sexual behavior, unspecified type    Hx of prostatitis     This visit was a preventative care visit, also known as wellness visit or routine physical.   Topics typically include healthy lifestyle, diet, exercise, preventative care, vaccinations, sick and well care, proper use of emergency dept and after hours care, as well as other concerns.     Recommendations: Continue to return yearly for your annual wellness and preventative care visits.  This gives [place] a chance to discuss healthy lifestyle, exercise, vaccinations, review your chart record, and perform screenings where appropriate.  I recommend you see your eye doctor yearly for routine vision care.  I recommend you see your dentist yearly for routine dental care including hygiene visits twice yearly.   Vaccination recommendations were reviewed Immunization History  Administered Date(s) Administered   HPV 9-valent 07/24/2022, 09/27/2022, 03/31/2023   Influenza Split 11/01/2022   Influenza-Unspecified 10/26/2020, 11/26/2021, 11/01/2022, 10/31/2023   Moderna Covid-19 Fall Seasonal Vaccine 38yrs & older 01/26/2024   Moderna Sars-Covid-2 Vaccination 12/29/2019, 01/26/2020, 11/02/2020   Td 01/19/2021   Tdap 06/19/2010   Vaccinia,smallpox Monkeypox Vaccine Live,pf 08/21/2023   Consider Prevnar 21 through employee health  Counseled on the Hepatitis A virus vaccine.  Vaccine information sheet given.  Hepatitis A vaccine given after consent obtained.  Patient was advised to return in 6 months for Hep A #2.    Screening for cancer: Colon cancer screening: Age 62  Testicular cancer screening You should do a monthly self testicular exam if you are between 76-24 years old  We discussed PSA, prostate exam, and prostate cancer screening risks/benefits.   Age  80  Skin cancer screening: Check your skin regularly for new changes, growing lesions, or other lesions of concern Come in for evaluation if you have skin lesions of concern.  Lung cancer screening: If you have a greater than 20 pack year history of tobacco use, then you may qualify for lung cancer screening with a chest CT scan.   Please call your insurance company to inquire about coverage for this test.  We currently don't have screenings for other cancers besides breast, cervical, colon, and lung cancers.  If you have a strong family history of cancer or have other cancer screening concerns, please let me know.     Bone health: Get at least 150 minutes of aerobic exercise weekly Get weight bearing exercise at least once weekly Bone density test:  A bone density test is an imaging test that uses a type of X-ray to measure the amount of calcium and other minerals in your bones. The test may be used to diagnose or screen you for a condition that causes weak or thin bones (osteoporosis), predict your risk for a broken bone (fracture), or determine how well your osteoporosis treatment is working. The bone density test is recommended for females 65 and older, or females or males <65 if certain risk factors such as thyroid disease, long term use of steroids such as for asthma or rheumatological issues, vitamin D deficiency, estrogen deficiency, family history of osteoporosis, self or family history of fragility fracture in first degree relative.    Heart health: Get at least 150 minutes of aerobic exercise weekly Limit alcohol It is important to maintain a healthy blood pressure and healthy cholesterol numbers  Heart disease screening: Screening for heart disease includes screening for blood pressure, fasting lipids, glucose/diabetes screening, BMI height to weight ratio, reviewed of smoking status, physical activity, and diet.    Goals include blood pressure 120/80 or less, maintaining a  healthy lipid/cholesterol profile, preventing diabetes or keeping diabetes numbers under good control, not smoking or using tobacco products, exercising most days per week or at least 150 minutes per week of exercise, and eating healthy variety of fruits and vegetables, healthy oils, and avoiding unhealthy food choices like fried food, fast food, high sugar and high cholesterol foods.    Other tests may possibly include EKG test, CT coronary calcium score, echocardiogram, exercise treadmill stress test.     Medical care options: I recommend you continue to seek care here first for routine care.  We try really hard to have available  appointments Monday through Friday daytime hours for sick visits, acute visits, and physicals.  Urgent care should be used for after hours and weekends for significant issues that cannot wait till the next day.  The emergency department should be used for significant potentially life-threatening emergencies.  The emergency department is expensive, can often have long wait times for less significant concerns, so try to utilize primary care, urgent care, or telemedicine when possible to avoid unnecessary trips to the emergency department.  Virtual visits and telemedicine have been introduced since the pandemic started in 2020, and can be convenient ways to receive medical care.  We offer virtual appointments as well to assist you in a variety of options to seek medical care.    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  He has follow-up planned with ophthalmology  STD screening for regular follow-up on Truvada PrEP medication  Cold sore-discussed proper use of Valtrex, prescription for as needed use.  Given the current crusting area of the nose, probably too late for Valtrex.  He can use some over-the-counter topical triple antibiotic for the next several  days  History of prostatitis-resolved currently no symptoms from November 2024  Gaines was seen today for annual exam.  Diagnoses and all orders for this visit:  Encounter for health maintenance examination in adult -     Comprehensive metabolic panel -     CBC -     Lipid panel -     TSH -     RPR+HIV+GC+CT Panel -     PSA  Vaccine counseling  Need for hepatitis A immunization  Screen for STD (sexually transmitted disease) -     RPR+HIV+GC+CT Panel  Cold sore  High risk sexual behavior, unspecified type -     RPR+HIV+GC+CT Panel  Hx of prostatitis -     PSA  Other orders -     valACYclovir (VALTREX) 1000 MG tablet; Take 2 tablets (2,000 mg total) by mouth 2 (two) times daily for 2 days. For flare ups.   Follow-up pending labs, yearly for physical

## 2024-02-21 ENCOUNTER — Other Ambulatory Visit: Payer: Self-pay | Admitting: Medical

## 2024-02-23 ENCOUNTER — Encounter: Payer: Federal, State, Local not specified - PPO | Admitting: Medical

## 2024-02-23 ENCOUNTER — Other Ambulatory Visit: Payer: Self-pay | Admitting: Medical

## 2024-02-23 LAB — CBC
Hematocrit: 44.9 % (ref 37.5–51.0)
Hemoglobin: 15.2 g/dL (ref 13.0–17.7)
MCH: 30.7 pg (ref 26.6–33.0)
MCHC: 33.9 g/dL (ref 31.5–35.7)
MCV: 91 fL (ref 79–97)
Platelets: 217 10*3/uL (ref 150–450)
RBC: 4.95 x10E6/uL (ref 4.14–5.80)
RDW: 11.8 % (ref 11.6–15.4)
WBC: 6.9 10*3/uL (ref 3.4–10.8)

## 2024-02-23 LAB — RPR+HIV+GC+CT PANEL
HIV Screen 4th Generation wRfx: NONREACTIVE
RPR Ser Ql: NONREACTIVE

## 2024-02-23 LAB — COMPREHENSIVE METABOLIC PANEL
ALT: 33 [IU]/L (ref 0–44)
AST: 31 [IU]/L (ref 0–40)
Albumin: 4.6 g/dL (ref 4.1–5.1)
Alkaline Phosphatase: 56 [IU]/L (ref 44–121)
BUN/Creatinine Ratio: 19 (ref 9–20)
BUN: 20 mg/dL (ref 6–24)
Bilirubin Total: 0.3 mg/dL (ref 0.0–1.2)
CO2: 24 mmol/L (ref 20–29)
Calcium: 9.6 mg/dL (ref 8.7–10.2)
Chloride: 103 mmol/L (ref 96–106)
Creatinine, Ser: 1.07 mg/dL (ref 0.76–1.27)
Globulin, Total: 2.4 g/dL (ref 1.5–4.5)
Glucose: 90 mg/dL (ref 70–99)
Potassium: 4.9 mmol/L (ref 3.5–5.2)
Sodium: 141 mmol/L (ref 134–144)
Total Protein: 7 g/dL (ref 6.0–8.5)
eGFR: 89 mL/min/{1.73_m2} (ref >59)

## 2024-02-23 LAB — LIPID PANEL
Chol/HDL Ratio: 3 {ratio} (ref 0.0–5.0)
Cholesterol, Total: 128 mg/dL (ref 100–199)
HDL: 42 mg/dL (ref >39)
LDL Chol Calc (NIH): 74 mg/dL (ref 0–99)
Triglycerides: 52 mg/dL (ref 0–149)
VLDL Cholesterol Cal: 12 mg/dL (ref 5–40)

## 2024-02-23 LAB — TSH: TSH: 0.459 u[IU]/mL (ref 0.450–4.500)

## 2024-02-23 LAB — PSA: Prostate Specific Ag, Serum: 2 ng/mL (ref 0.0–4.0)

## 2024-02-23 MED ORDER — EMTRICITABINE-TENOFOVIR DF 200-300 MG PO TABS: 1.0000 | ORAL_TABLET | Freq: Every day | ORAL | 0 refills | Status: DC

## 2024-02-23 NOTE — Progress Notes (Signed)
 Results sent through MyChart

## 2024-05-17 ENCOUNTER — Other Ambulatory Visit: Payer: Self-pay | Admitting: Medical

## 2024-05-17 DIAGNOSIS — Z299 Encounter for prophylactic measures, unspecified: Secondary | ICD-10-CM

## 2024-05-17 DIAGNOSIS — Z7251 High risk heterosexual behavior: Secondary | ICD-10-CM

## 2024-05-17 DIAGNOSIS — Z113 Encounter for screening for infections with a predominantly sexual mode of transmission: Secondary | ICD-10-CM

## 2024-05-17 MED ORDER — EMTRICITABINE-TENOFOVIR DF 200-300 MG PO TABS: 1.0000 | ORAL_TABLET | Freq: Every day | ORAL | 0 refills | Status: DC

## 2024-05-19 ENCOUNTER — Ambulatory Visit: Admitting: Family Medicine

## 2024-05-19 ENCOUNTER — Encounter: Payer: Self-pay | Admitting: Family Medicine

## 2024-05-19 VITALS — BP 138/90 | HR 81 | Wt 170.6 lb

## 2024-05-19 DIAGNOSIS — M545 Low back pain, unspecified: Secondary | ICD-10-CM | POA: Diagnosis not present

## 2024-05-19 DIAGNOSIS — Z299 Encounter for prophylactic measures, unspecified: Secondary | ICD-10-CM

## 2024-05-19 DIAGNOSIS — Z113 Encounter for screening for infections with a predominantly sexual mode of transmission: Secondary | ICD-10-CM

## 2024-05-19 DIAGNOSIS — Z7251 High risk heterosexual behavior: Secondary | ICD-10-CM

## 2024-05-19 MED ORDER — CARISOPRODOL 350 MG PO TABS: 350.0000 mg | ORAL_TABLET | Freq: Four times a day (QID) | ORAL | 0 refills | Status: DC | PRN

## 2024-05-19 MED ORDER — HYDROCODONE-ACETAMINOPHEN 5-325 MG PO TABS: 1.0000 | ORAL_TABLET | Freq: Four times a day (QID) | ORAL | 0 refills | Status: DC | PRN

## 2024-05-19 NOTE — Progress Notes (Signed)
   Subjective:    Patient ID: Elijah Rojas, male    DOB: [birth year], 43 y.o.   MRN: [id number]  HPI He is here for follow-up concerning PrEP.  He has been using PrEP and mainly involved in oral activities.  He is having no symptoms related to that.  He is taking doxycycline  mainly for his skin at 200 mg/day. Earlier today while doing back flexion exercises he experienced some low back pain that was quite severe to the point that he had to lay down.  No numbness tingling weakness going down his leg. He also has a trip planned to [place] within the next several days and is concerned about the having the pain that he is having.  Review of Systems     Objective:    Physical Exam Alert and in slight discomfort.  Splinting is noted of his back.  Tender to palpation along the right lower paraspinal muscles approximately 3 inches in length.       Assessment & Plan:  Acute midline low back pain without sciatica - Plan: HYDROcodone-acetaminophen  (NORCO/VICODIN) 5-325 MG tablet, carisoprodol (SOMA) 350 MG tablet  High risk sexual behavior, unspecified type - Plan: HIV Antibody (routine testing w rflx), RPR+HIV+GC+CT Panel, Ct/GC NAA, Pharyngeal  Prophylactic measure - Plan: HIV Antibody (routine testing w rflx)  Screen for STD (sexually transmitted disease) - Plan: HIV Antibody (routine testing w rflx)  Ice for 20 minutes 3 times per day for the next 48 to 72 hours then he can switch to heat after that and gentle stretching.  Always start with Tylenol  and then go to either Advil or Aleve and if that does not work then you get the muscle relaxer undercoating actually put in when I said you never know Discussed STD prevention with the use of doxycycline  after he finishes the use of doxycycline  for his face.  Recommended 200 mg with sexual activity as well as continuing on Truvada.

## 2024-05-19 NOTE — Patient Instructions (Signed)
 Ice for 20 minutes 3 times per day for the next 48 to 72 hours then he can switch to heat after that and gentle stretching.  Always start with Tylenol  and then go to either Advil or Aleve and if that does not work then you get the muscle relaxer undercoating actually put in when I said you never know

## 2024-05-21 ENCOUNTER — Encounter: Payer: Self-pay | Admitting: Family Medicine

## 2024-05-21 LAB — RPR+HIV+GC+CT PANEL
Chlamydia trachomatis, NAA: NEGATIVE
HIV Screen 4th Generation wRfx: NONREACTIVE
Neisseria Gonorrhoeae by PCR: NEGATIVE
RPR Ser Ql: NONREACTIVE

## 2024-05-21 LAB — CT/GC NAA, PHARYNGEAL
C TRACH RRNA NPH QL PCR: NEGATIVE
N GONORRHOEA RRNA NPH QL PCR: NEGATIVE

## 2024-05-21 LAB — SPECIMEN STATUS REPORT

## 2024-05-23 ENCOUNTER — Ambulatory Visit: Payer: Self-pay | Admitting: Family Medicine

## 2024-06-30 DIAGNOSIS — Z947 Corneal transplant status: Secondary | ICD-10-CM | POA: Diagnosis not present

## 2024-06-30 DIAGNOSIS — H18601 Keratoconus, unspecified, right eye: Secondary | ICD-10-CM | POA: Diagnosis not present

## 2024-08-23 ENCOUNTER — Other Ambulatory Visit (INDEPENDENT_AMBULATORY_CARE_PROVIDER_SITE_OTHER): Payer: Federal, State, Local not specified - PPO

## 2024-08-23 DIAGNOSIS — Z23 Encounter for immunization: Secondary | ICD-10-CM | POA: Diagnosis not present

## 2024-08-23 DIAGNOSIS — Z113 Encounter for screening for infections with a predominantly sexual mode of transmission: Secondary | ICD-10-CM | POA: Diagnosis not present

## 2024-08-23 DIAGNOSIS — Z7251 High risk heterosexual behavior: Secondary | ICD-10-CM

## 2024-08-24 ENCOUNTER — Ambulatory Visit: Payer: Self-pay | Admitting: Medical

## 2024-08-24 ENCOUNTER — Other Ambulatory Visit: Payer: Self-pay | Admitting: Medical

## 2024-08-24 LAB — HIV ANTIBODY (ROUTINE TESTING W REFLEX): HIV Screen 4th Generation wRfx: NONREACTIVE

## 2024-08-24 MED ORDER — EMTRICITABINE-TENOFOVIR DF 200-300 MG PO TABS: 1.0000 | ORAL_TABLET | Freq: Every day | ORAL | 0 refills | Status: DC

## 2024-08-24 NOTE — Progress Notes (Signed)
 Results through MyChart

## 2024-11-19 DIAGNOSIS — F4322 Adjustment disorder with anxiety: Secondary | ICD-10-CM | POA: Diagnosis not present

## 2024-11-23 ENCOUNTER — Ambulatory Visit: Payer: Self-pay | Admitting: Family Medicine

## 2024-11-23 ENCOUNTER — Encounter: Payer: Self-pay | Admitting: Family Medicine

## 2024-11-23 VITALS — BP 130/80 | HR 64 | Ht 73.0 in | Wt 158.8 lb

## 2024-11-23 DIAGNOSIS — Z7251 High risk heterosexual behavior: Secondary | ICD-10-CM | POA: Diagnosis not present

## 2024-11-23 DIAGNOSIS — Z113 Encounter for screening for infections with a predominantly sexual mode of transmission: Secondary | ICD-10-CM | POA: Diagnosis not present

## 2024-11-23 MED ORDER — DOXYCYCLINE MONOHYDRATE 100 MG PO CAPS: 200.0000 mg | ORAL_CAPSULE | Freq: Every day | ORAL | 0 refills | Status: DC

## 2024-11-23 MED ORDER — EMTRICITABINE-TENOFOVIR DF 200-300 MG PO TABS: 1.0000 | ORAL_TABLET | Freq: Every day | ORAL | 1 refills | Status: AC

## 2024-11-23 NOTE — Progress Notes (Signed)
   Name: Elijah Rojas   Date of Visit: 11/23/24   Date of last visit with me: Visit date not found   CHIEF COMPLAINT:  Chief Complaint  Patient presents with   med check    Patient is here for med check. Thinks he would like to also get on doxy for PRep as well as Truvada. Asking for labs today. Has lost some weight lately.        HPI:  Discussed the use of AI scribe software for clinical note transcription with the patient, who gave verbal consent to proceed.  History of Present Illness   Frisco Degregory is a 43 year old male who presents for a medication check and STI screening.  He is here for a medication check, including a refill of Truvada, which he takes without any issues. He is interested in doxycycline  for prophylactic use after condomless sex, having used it in the past for acne treatment and has some available at home.  He has experienced recent weight loss. He is interested in an STI screening, which will include tests for HIV, gonorrhea, chlamydia, trichomonas, and syphilis. No symptoms of a UTI and no current urinary symptoms are reported.  He works at the [employer] in [place] and mentions needing a flu shot by the end of the week, which he plans to get at work to ensure it is on file.         OBJECTIVE:       02/20/2024    8:58 AM  Depression screen PHQ 2/9  Decreased Interest 0  Down, Depressed, Hopeless 0  PHQ - 2 Score 0     BP Readings from Last 3 Encounters:  11/23/24 130/80  05/19/24 (!) 138/90  02/20/24 120/76    BP 130/80   Pulse 64   Ht 6' 1 (1.854 m)   Wt 158 lb 12.8 oz (72 kg)   BMI 20.95 kg/m    Physical Exam          Physical Exam Constitutional:      Appearance: Normal appearance.  Neurological:     General: No focal deficit present.     Mental Status: He is alert and oriented to person, place, and time. Mental status is at baseline.     ASSESSMENT/PLAN:   Assessment & Plan High risk sexual behavior, unspecified  type  Screen for STD (sexually transmitted disease)    Assessment and Plan    High risk sexual behavior Engages in high-risk sexual behavior. On Truvada for HIV prevention. Discussed doxycycline  for STI post-exposure prophylaxis. Emphasized condom use with new partners. - Refilled Truvada with additional refills. - Prescribed doxycycline  200 mg for post-exposure prophylaxis within 72 hours of condomless sex.  Screening for sexually transmitted infections Routine STI screening necessary due to high-risk behavior. - Ordered blood tests for HIV, gonorrhea, chlamydia, trichomonas, and syphilis. - Ordered urine test for UTI if symptoms develop.         Jerid Catherman A. Vita MD [facility]

## 2024-11-24 ENCOUNTER — Other Ambulatory Visit: Payer: Self-pay | Admitting: Medical

## 2024-11-24 LAB — RPR+HIV+GC+CT PANEL
HIV Screen 4th Generation wRfx: NONREACTIVE
RPR Ser Ql: NONREACTIVE

## 2024-11-24 LAB — CBC WITH DIFFERENTIAL/PLATELET
Basophils Absolute: 0.1 x10E3/uL (ref 0.0–0.2)
Basos: 1 %
EOS (ABSOLUTE): 0.1 x10E3/uL (ref 0.0–0.4)
Eos: 1 %
Hematocrit: 46 % (ref 37.5–51.0)
Hemoglobin: 15 g/dL (ref 13.0–17.7)
Immature Grans (Abs): 0 x10E3/uL (ref 0.0–0.1)
Immature Granulocytes: 0 %
Lymphocytes Absolute: 1.6 x10E3/uL (ref 0.7–3.1)
Lymphs: 18 %
MCH: 30.1 pg (ref 26.6–33.0)
MCHC: 32.6 g/dL (ref 31.5–35.7)
MCV: 92 fL (ref 79–97)
Monocytes Absolute: 0.4 x10E3/uL (ref 0.1–0.9)
Monocytes: 5 %
Neutrophils Absolute: 6.8 x10E3/uL (ref 1.4–7.0)
Neutrophils: 75 %
Platelets: 217 x10E3/uL (ref 150–450)
RBC: 4.99 x10E6/uL (ref 4.14–5.80)
RDW: 12.9 % (ref 11.6–15.4)
WBC: 9 x10E3/uL (ref 3.4–10.8)

## 2024-11-24 MED ORDER — VALACYCLOVIR HCL 1 G PO TABS: 2000.0000 mg | ORAL_TABLET | Freq: Two times a day (BID) | ORAL | 1 refills | Status: AC

## 2024-11-26 DIAGNOSIS — F4322 Adjustment disorder with anxiety: Secondary | ICD-10-CM | POA: Diagnosis not present

## 2024-11-29 ENCOUNTER — Ambulatory Visit: Payer: Self-pay | Admitting: Family Medicine

## 2024-12-03 DIAGNOSIS — F4322 Adjustment disorder with anxiety: Secondary | ICD-10-CM | POA: Diagnosis not present

## 2024-12-07 NOTE — Telephone Encounter (Signed)
 Patient on schedule for 12/10 seeing Dr. Joyce

## 2024-12-08 ENCOUNTER — Ambulatory Visit: Admitting: Family Medicine

## 2024-12-08 VITALS — BP 136/78 | HR 80 | Ht 73.0 in | Wt 159.4 lb

## 2024-12-08 DIAGNOSIS — Z23 Encounter for immunization: Secondary | ICD-10-CM

## 2024-12-08 DIAGNOSIS — Z113 Encounter for screening for infections with a predominantly sexual mode of transmission: Secondary | ICD-10-CM

## 2024-12-08 DIAGNOSIS — Z7251 High risk heterosexual behavior: Secondary | ICD-10-CM | POA: Diagnosis not present

## 2024-12-08 MED ORDER — DOXYCYCLINE MONOHYDRATE 100 MG PO CAPS: 200.0000 mg | ORAL_CAPSULE | Freq: Every day | ORAL | 1 refills | Status: AC

## 2024-12-08 NOTE — Progress Notes (Signed)
° °  Subjective:    Patient ID: Elijah Rojas, male    DOB: 11-06-1981, 43 y.o.   MRN: [id number]  HPI He is here for consult concerning potential STD exposure.  He is on Doxy prep as well as regular PrEP and is doing well on it.  He has been more sexually active and would like to have further testing done for oral as well as anal exposure.  He is having no sore throat, fever, chills, discharge from the penis or rectal discomfort.   Review of Systems     Objective:    Physical Exam Alert and in no distress.  Throat and anal cultures were done.       Assessment & Plan:  High risk sexual behavior, unspecified type  Need for COVID-19 vaccine - Plan: Pfizer Comirnaty Covid-19 Vaccine 73yrs & older  Screen for STD (sexually transmitted disease) I will also increase the number of doxycycline  pills given to him.

## 2024-12-10 ENCOUNTER — Telehealth: Payer: Self-pay

## 2024-12-10 DIAGNOSIS — F4322 Adjustment disorder with anxiety: Secondary | ICD-10-CM | POA: Diagnosis not present

## 2024-12-10 NOTE — Telephone Encounter (Signed)
 Copied from CRM #[id number]. Topic: Clinical - Lab/Test Results >> Dec 10, 2024  4:19 PM Myrick T wrote: Reason for CRM: patient called stated he got his results and he knows they are positive and needs to get something today. Per [facility] physician had left for the day and patient would need to wait until Monday as all his results had not come in yet. Please f/u with patient once everything has resulted

## 2024-12-11 LAB — CT/NG RNA, TMA RECTAL
Chlamydia trachomatis, NAA: NEGATIVE
Neisseria gonorrhoeae, NAA: POSITIVE — AB

## 2024-12-11 LAB — CT/GC NAA, PHARYNGEAL
C TRACH RRNA NPH QL PCR: NEGATIVE
N GONORRHOEA RRNA NPH QL PCR: POSITIVE — AB

## 2024-12-13 ENCOUNTER — Ambulatory Visit: Payer: Self-pay | Admitting: Family Medicine

## 2024-12-13 ENCOUNTER — Telehealth: Payer: Self-pay

## 2024-12-13 ENCOUNTER — Other Ambulatory Visit: Payer: Self-pay

## 2024-12-13 ENCOUNTER — Other Ambulatory Visit

## 2024-12-13 DIAGNOSIS — A549 Gonococcal infection, unspecified: Secondary | ICD-10-CM | POA: Diagnosis not present

## 2024-12-13 MED ORDER — CEFTRIAXONE SODIUM 250 MG IJ SOLR
250.0000 mg | Freq: Once | INTRAMUSCULAR | Status: AC
  Administered 2024-12-13: 15:00:00 250 mg via INTRAMUSCULAR

## 2024-12-13 MED ORDER — AZITHROMYCIN 500 MG PO TABS: ORAL_TABLET | ORAL | 0 refills | Status: AC

## 2024-12-13 NOTE — Telephone Encounter (Signed)
 Per Dr. Joyce pt. Needs to come in today for a shot of Rocephen. I got the pt. Scheduled today at 2:30.   Copied from CRM #[id number]. Topic: Clinical - Lab/Test Results >> Dec 10, 2024  4:19 PM Myrick T wrote: Reason for CRM: patient called stated he got his results and he knows they are positive and needs to get something today. Per [facility] physician had left for the day and patient would need to wait until Monday as all his results had not come in yet. Please f/u with patient once everything has resulted >> Dec 13, 2024  9:16 AM Franky GRADE wrote: Patient is calling to follow up and check if Alm Gent has had the opportunity to review his labs and offer recommendations as patient was able to review his results and is concerned with the findings.

## 2024-12-13 NOTE — Telephone Encounter (Signed)
 Pt. Wanted to know when should he come back to be retested fro gonorrhea. He was asking if he should come back next week?

## 2024-12-17 DIAGNOSIS — F4322 Adjustment disorder with anxiety: Secondary | ICD-10-CM | POA: Diagnosis not present

## 2025-02-24 ENCOUNTER — Encounter: Payer: Federal, State, Local not specified - PPO | Admitting: Medical
